# Patient Record
Sex: Female | Born: 1985 | Race: White | Hispanic: No | Marital: Married | Smoking: Never smoker
Health system: Southern US, Community
[De-identification: ages and names within clinical notes are randomized; demographics above are authoritative.]

## PROBLEM LIST (undated history)

## (undated) ENCOUNTER — Inpatient Hospital Stay (HOSPITAL_COMMUNITY): Payer: Self-pay

## (undated) DIAGNOSIS — S060X9A Concussion with loss of consciousness of unspecified duration, initial encounter: Secondary | ICD-10-CM

## (undated) DIAGNOSIS — S060XAA Concussion with loss of consciousness status unknown, initial encounter: Secondary | ICD-10-CM

## (undated) DIAGNOSIS — M2530 Other instability, unspecified joint: Secondary | ICD-10-CM

## (undated) DIAGNOSIS — B019 Varicella without complication: Secondary | ICD-10-CM

## (undated) DIAGNOSIS — K5909 Other constipation: Secondary | ICD-10-CM

## (undated) DIAGNOSIS — I7300 Raynaud's syndrome without gangrene: Secondary | ICD-10-CM

## (undated) HISTORY — DX: Raynaud's syndrome without gangrene: I73.00

## (undated) HISTORY — DX: Other instability, unspecified joint: M25.30

## (undated) HISTORY — DX: Varicella without complication: B01.9

## (undated) HISTORY — PX: WISDOM TOOTH EXTRACTION: SHX21

## (undated) HISTORY — DX: Other constipation: K59.09

## (undated) HISTORY — DX: Concussion with loss of consciousness of unspecified duration, initial encounter: S06.0X9A

## (undated) HISTORY — DX: Concussion with loss of consciousness status unknown, initial encounter: S06.0XAA

---
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
## 2004-12-19 ENCOUNTER — Other Ambulatory Visit: Admission: RE | Admit: 2004-12-19 | Discharge: 2004-12-19 | Payer: Self-pay | Admitting: Obstetrics and Gynecology

## 2007-08-18 ENCOUNTER — Ambulatory Visit: Payer: Self-pay | Admitting: Internal Medicine

## 2007-08-18 DIAGNOSIS — I7300 Raynaud's syndrome without gangrene: Secondary | ICD-10-CM | POA: Insufficient documentation

## 2007-08-18 DIAGNOSIS — Z8719 Personal history of other diseases of the digestive system: Secondary | ICD-10-CM | POA: Insufficient documentation

## 2007-08-18 DIAGNOSIS — M25569 Pain in unspecified knee: Secondary | ICD-10-CM | POA: Insufficient documentation

## 2007-08-22 ENCOUNTER — Encounter: Payer: Self-pay | Admitting: Internal Medicine

## 2007-12-02 ENCOUNTER — Telehealth: Payer: Self-pay | Admitting: Internal Medicine

## 2007-12-31 ENCOUNTER — Telehealth: Payer: Self-pay | Admitting: *Deleted

## 2008-03-12 ENCOUNTER — Ambulatory Visit: Payer: Self-pay | Admitting: Internal Medicine

## 2008-03-12 DIAGNOSIS — K5909 Other constipation: Secondary | ICD-10-CM | POA: Insufficient documentation

## 2008-06-17 ENCOUNTER — Ambulatory Visit: Payer: Self-pay | Admitting: Internal Medicine

## 2008-06-17 DIAGNOSIS — R112 Nausea with vomiting, unspecified: Secondary | ICD-10-CM | POA: Insufficient documentation

## 2008-06-18 ENCOUNTER — Telehealth: Payer: Self-pay | Admitting: Internal Medicine

## 2008-06-18 LAB — CONVERTED CEMR LAB: hCG, Beta Chain, Quant, S: 0.67 milliintl units/mL

## 2009-04-14 ENCOUNTER — Inpatient Hospital Stay (HOSPITAL_COMMUNITY): Admission: AD | Admit: 2009-04-14 | Discharge: 2009-04-15 | Payer: Self-pay | Admitting: Obstetrics and Gynecology

## 2009-09-05 ENCOUNTER — Ambulatory Visit: Payer: Self-pay | Admitting: Internal Medicine

## 2009-09-07 ENCOUNTER — Ambulatory Visit: Payer: Self-pay | Admitting: Internal Medicine

## 2010-03-14 NOTE — Assessment & Plan Note (Signed)
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  She enjoys excellent health, takes no controlled  medications, and has no activity restrictions.  She has had PPD skin test in the past, which have always been negative  Preventive Screening-Counseling & Management  Alcohol-Tobacco     Smoking Status: never  Allergies (verified): 1)  ! Beta Blockers  Past History:  Past Medical History: Reviewed history from 08/18/2007 and no changes required. mild Raynaud's phenomenon right knee instability  Past Surgical History: Reviewed history from 08/18/2007 and no changes required. 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    Physical Exam  General:  Well-developed,well-nourished,in no acute distress; alert,appropriate and cooperative throughout examination Head:  Normocephalic and atraumatic without obvious abnormalities. No apparent alopecia or balding. Eyes:  No corneal or conjunctival inflammation noted. EOMI. Perrla. Funduscopic exam benign, without hemorrhages, exudates or papilledema. Vision grossly normal. Ears:  External ear exam shows no significant lesions or deformities.  Otoscopic examination reveals clear canals, tympanic membranes are intact bilaterally without bulging, retraction, inflammation or discharge. Hearing is grossly normal bilaterally. Mouth:  Oral mucosa and oropharynx without lesions or exudates.  Teeth in good repair. Neck:  No deformities, masses, or tenderness noted. Chest Wall:  No deformities, masses, or tenderness noted. Lungs:  Normal respiratory effort, chest expands symmetrically. Lungs are clear to auscultation, no crackles or wheezes. Heart:  Normal rate and regular rhythm. S1 and S2 normal without gallop, murmur, click, rub or other extra sounds. Abdomen:  Bowel sounds positive,abdomen soft and non-tender without masses, organomegaly or hernias noted. Msk:  No deformity or scoliosis noted of thoracic or lumbar spine.   Pulses:  R and L carotid,radial,femoral,dorsalis pedis and posterior tibial pulses are full and equal bilaterally Extremities:  No clubbing, cyanosis, edema, or deformity noted with normal full range of motion of all joints.   Neurologic:  alert & oriented X3, cranial nerves II-XII intact, and gait normal.  alert & oriented X3, cranial nerves II-XII intact, and gait normal.   Skin:  Intact without suspicious lesions or rashes Cervical Nodes:  No lymphadenopathy noted Psych:  Cognition and judgment appear intact. Alert and cooperative with normal attention span and concentration. No apparent  delusions, illusions, hallucinations   Impression &  Recommendations:  Problem # 1:  PREVENTIVE HEALTH CARE (ICD-V70.0)  Complete Medication List: 1)  Ortho Tri-cyclen Lo 0.025 Mg Tabs (Norgestimate-ethinyl estradiol) .Marland Kitchen.. 1 once daily  Other Orders: TB Skin Test [phone]) Admin 1st Vaccine ([id number])  Patient Instructions: 1)  It is important that you exercise regularly at least 20 minutes 5 times a week. If you develop chest pain, have severe difficulty breathing, or feel very tired , stop exercising immediately and seek medical attention.   Immunizations Administered:  PPD Skin Test:    Vaccine Type: PPD    Site: right forearm    Mfr: Sanofi Pasteur    Dose: 0.1 ml    Route: ID    Given by: Duard Brady LPN    Exp. Date: 11/25/2010    Lot #: [id number]    Physician counseled: yes    Appended Document: health exam for teachers     Allergies: 1)  ! Beta Blockers   Complete Medication List: 1)  Ortho Tri-cyclen Lo 0.025 Mg Tabs (Norgestimate-ethinyl estradiol) .Marland Kitchen.. 1 once daily  Other Orders: Tdap => 41yrs IM ([id number]) Admin 1st Vaccine (56213)    Immunizations Administered:  Tetanus Vaccine:    Vaccine Type: Tdap    Site: left deltoid    Mfr: GlaxoSmithKline    Dose: 0.5 ml    Route: IM    Given by: Duard Brady LPN    Exp. Date: 03/03/2011    Lot #: [id number]    VIS given: 12/31/06 version given September 05, 2009.    Physician counseled: yes

## 2010-03-14 NOTE — Assessment & Plan Note (Signed)
Summary: tb reading  Nurse Visit   Vitals Entered By: Duard Brady LPN (September 07, 2009 11:36 AM)  Allergies: 1)  ! Beta Blockers  PPD Results    Date of reading: 09/07/2009    Results: < 5mm    Interpretation: negative

## 2010-05-08 LAB — CBC
HCT: 37 % (ref 36.0–46.0)
HCT: 43.6 % (ref 36.0–46.0)
Hemoglobin: 12.4 g/dL (ref 12.0–15.0)
Hemoglobin: 14.4 g/dL (ref 12.0–15.0)
MCHC: 33.5 g/dL (ref 30.0–36.0)
Platelets: 200 10*3/uL (ref 150–400)
RBC: 3.82 MIL/uL — ABNORMAL LOW (ref 3.87–5.11)
RBC: 4.46 MIL/uL (ref 3.87–5.11)
WBC: 12.1 10*3/uL — ABNORMAL HIGH (ref 4.0–10.5)

## 2010-10-13 ENCOUNTER — Ambulatory Visit (INDEPENDENT_AMBULATORY_CARE_PROVIDER_SITE_OTHER): Payer: BC Managed Care – PPO | Admitting: Internal Medicine

## 2010-10-13 ENCOUNTER — Encounter: Payer: Self-pay | Admitting: Internal Medicine

## 2010-10-13 VITALS — BP 100/68 | Temp 98.2°F | Wt 112.0 lb

## 2010-10-13 DIAGNOSIS — J069 Acute upper respiratory infection, unspecified: Secondary | ICD-10-CM

## 2010-10-13 NOTE — Progress Notes (Signed)
  Subjective:    Patient ID: Sarah Mullins, female    DOB: Mar 05, 1985, 25 y.o.   MRN: [id number]  HPI 25 year old patient who presents with a five-day history of head and chest congestion. She has significant sinus congestion her that has improved somewhat she has had productive cough and also has improved she has had no fever but still feels a bit weak and washed out. 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 Denies any fever or chills no chest pain shortness of breath or wheezing.   Review of Systems  Constitutional: Positive for fatigue. Negative for fever and diaphoresis.  HENT: Positive for congestion. Negative for hearing loss, sore throat, rhinorrhea, dental problem, sinus pressure and tinnitus.   Eyes: Negative for pain, discharge and visual disturbance.  Respiratory: Positive for cough. Negative for shortness of breath.   Cardiovascular: Negative for chest pain, palpitations and leg swelling.  Gastrointestinal: Negative for nausea, vomiting, abdominal pain, diarrhea, constipation, blood in stool and abdominal distention.  Genitourinary: Negative for dysuria, urgency, frequency, hematuria, flank pain, vaginal bleeding, vaginal discharge, difficulty urinating, vaginal pain and pelvic pain.  Musculoskeletal: Negative for joint swelling, arthralgias and gait problem.  Skin: Negative for rash.  Neurological: Negative for dizziness, syncope, speech difficulty, weakness, numbness and headaches.  Hematological: Negative for adenopathy.  Psychiatric/Behavioral: Negative for behavioral problems, dysphoric mood and agitation. The patient is not nervous/anxious.        Objective:   Physical Exam  Constitutional: She is oriented to person, place, and time. She appears well-developed and well-nourished. No distress.  HENT:  Head: Normocephalic.  Right Ear: External ear normal.  Left Ear: External ear normal.  Mouth/Throat: Oropharynx is clear and moist.  Eyes:  Conjunctivae and EOM are normal. Pupils are equal, round, and reactive to light.  Neck: Normal range of motion. Neck supple. No thyromegaly present.  Cardiovascular: Normal rate, regular rhythm, normal heart sounds and intact distal pulses.   Pulmonary/Chest: Effort normal and breath sounds normal. No respiratory distress. She has no wheezes. She has no rales. She exhibits no tenderness.  Abdominal: Soft. Bowel sounds are normal. She exhibits no mass. There is no tenderness.  Musculoskeletal: Normal range of motion.  Lymphadenopathy:    She has no cervical adenopathy.  Neurological: She is alert and oriented to person, place, and time.  Skin: Skin is warm and dry. No rash noted.  Psychiatric: She has a normal mood and affect. Her behavior is normal.          Assessment & Plan:    Viral URI-  will treat symptomatically

## 2010-10-13 NOTE — Patient Instructions (Signed)
Get plenty of rest, Drink lots of  clear liquids, and use Tylenol or ibuprofen for fever and discomfort.    Rezira 1/2-1  tsp every 6 hours for cough  Acute bronchitis symptoms for less than 10 days are generally not helped by antibiotics.  Take over-the-counter expectorants and cough medications such as  Mucinex DM.  Call if there is no improvement in 5 to 7 days or if he developed worsening cough, fever, or new symptoms, such as shortness of breath or chest pain.

## 2011-02-13 NOTE — L&D Delivery Note (Signed)
Delivery Note At 10:27 PM a viable and healthy female was delivered via Vaginal, Spontaneous Delivery (Presentation: Left Occiput; Anterior  ).  APGAR: 9 ,9 ; weight Pending.   Placenta status: Intact, Spontaneous.  Cord: 3V   Anesthesia: Local  Episiotomy: None Lacerations: Left Periurethral Suture Repair: 4-0 vicryl Est. Blood Loss (mL): 350 cc  Mom to postpartum.  Baby to nursery-stable.  Naji Mehringer H. 01/03/2012, 10:55 PM

## 2011-04-24 ENCOUNTER — Ambulatory Visit (INDEPENDENT_AMBULATORY_CARE_PROVIDER_SITE_OTHER): Payer: BC Managed Care – PPO | Admitting: Internal Medicine

## 2011-04-24 ENCOUNTER — Encounter: Payer: Self-pay | Admitting: Internal Medicine

## 2011-04-24 VITALS — BP 88/60 | Temp 98.2°F | Wt 120.0 lb

## 2011-04-24 DIAGNOSIS — J069 Acute upper respiratory infection, unspecified: Secondary | ICD-10-CM

## 2011-04-24 MED ORDER — HYDROCODONE-HOMATROPINE 5-1.5 MG/5ML PO SYRP: 5.0000 mL | ORAL_SOLUTION | Freq: Four times a day (QID) | ORAL | Status: AC | PRN

## 2011-04-24 NOTE — Patient Instructions (Signed)
Get plenty of rest, Drink lots of  clear liquids, and use Tylenol or ibuprofen for fever and discomfort.    Use saline irrigation, warm  moist compresses and over-the-counter decongestants only as directed.  Call if there is no improvement in 5 to 7 days, or sooner if you develop increasing pain, fever, or any new symptoms. 

## 2011-04-24 NOTE — Progress Notes (Signed)
  Subjective:    Patient ID: Sarah Mullins, female    DOB: [birth year], 26 y.o.   MRN: [id number]  HPI  26 year old patient who presents with a six-day history of head and chest congestion. She has had a mild productive cough. No documented fever shortness of breath wheezing or chills. She has been using a number of OTC medications with little benefit. She is off birth control pills but her periods have been regular. Over-the-counter medications have included Robitussin Alka-Seltzer and DayQuil    Review of Systems  Constitutional: Negative.   HENT: Positive for congestion, rhinorrhea and postnasal drip. Negative for hearing loss, sore throat, dental problem, sinus pressure and tinnitus.   Eyes: Negative for pain, discharge and visual disturbance.  Respiratory: Positive for cough. Negative for shortness of breath.   Cardiovascular: Negative for chest pain, palpitations and leg swelling.  Gastrointestinal: Negative for nausea, vomiting, abdominal pain, diarrhea, constipation, blood in stool and abdominal distention.  Genitourinary: Negative for dysuria, urgency, frequency, hematuria, flank pain, vaginal bleeding, vaginal discharge, difficulty urinating, vaginal pain and pelvic pain.  Musculoskeletal: Negative for joint swelling, arthralgias and gait problem.  Skin: Negative for rash.  Neurological: Negative for dizziness, syncope, speech difficulty, weakness, numbness and headaches.  Hematological: Negative for adenopathy.  Psychiatric/Behavioral: Negative for behavioral problems, dysphoric mood and agitation. The patient is not nervous/anxious.        Objective:   Physical Exam  Constitutional: She is oriented to person, place, and time. She appears well-developed and well-nourished.  HENT:  Head: Normocephalic.  Right Ear: External ear normal.  Left Ear: External ear normal.  Mouth/Throat: Oropharynx is clear and moist.       Considerable nasal congestion  Eyes: Conjunctivae and  EOM are normal. Pupils are equal, round, and reactive to light.  Neck: Normal range of motion. Neck supple. No thyromegaly present.  Cardiovascular: Normal rate, regular rhythm, normal heart sounds and intact distal pulses.   Pulmonary/Chest: Effort normal and breath sounds normal.  Abdominal: Soft. Bowel sounds are normal. She exhibits no mass. There is no tenderness.  Musculoskeletal: Normal range of motion.  Lymphadenopathy:    She has no cervical adenopathy.  Neurological: She is alert and oriented to person, place, and time.  Skin: Skin is warm and dry. No rash noted.  Psychiatric: She has a normal mood and affect. Her behavior is normal.          Assessment & Plan:   Viral URI. Will treat symptomatically

## 2011-05-03 ENCOUNTER — Telehealth: Payer: Self-pay | Admitting: *Deleted

## 2011-05-03 MED ORDER — AMOXICILLIN-POT CLAVULANATE 875-125 MG PO TABS: 1.0000 | ORAL_TABLET | Freq: Two times a day (BID) | ORAL | Status: AC

## 2011-05-03 NOTE — Telephone Encounter (Signed)
augmentim [phone]  #20 one BID

## 2011-05-03 NOTE — Telephone Encounter (Signed)
Notified pt. 

## 2011-05-03 NOTE — Telephone Encounter (Signed)
Pt is having sinus infection symptoms, headache, facial pain, and ear pain.  Is asking for an antibiotics, and is pregnant.  Would prefer not to come in.

## 2011-05-03 NOTE — Telephone Encounter (Signed)
Last seen 04/24/11 - head and chest congestion Please advise

## 2011-06-12 LAB — OB RESULTS CONSOLE ABO/RH

## 2011-06-12 LAB — OB RESULTS CONSOLE HEPATITIS B SURFACE ANTIGEN: Hepatitis B Surface Ag: NEGATIVE

## 2011-06-12 LAB — OB RESULTS CONSOLE GC/CHLAMYDIA
Chlamydia: NEGATIVE
Gonorrhea: NEGATIVE

## 2011-06-12 LAB — OB RESULTS CONSOLE ANTIBODY SCREEN: Antibody Screen: NEGATIVE

## 2011-10-30 ENCOUNTER — Encounter (HOSPITAL_COMMUNITY): Payer: Self-pay

## 2011-10-30 ENCOUNTER — Inpatient Hospital Stay (HOSPITAL_COMMUNITY)
Admission: AD | Admit: 2011-10-30 | Discharge: 2011-10-30 | Disposition: A | Discharge status: Home / Self Care | Payer: BC Managed Care – PPO | Source: Ambulatory Visit | Attending: Obstetrics and Gynecology | Admitting: Obstetrics and Gynecology

## 2011-10-30 DIAGNOSIS — O479 False labor, unspecified: Secondary | ICD-10-CM

## 2011-10-30 DIAGNOSIS — O4700 False labor before 37 completed weeks of gestation, unspecified trimester: Secondary | ICD-10-CM | POA: Insufficient documentation

## 2011-10-30 LAB — URINE MICROSCOPIC-ADD ON

## 2011-10-30 LAB — URINALYSIS, ROUTINE W REFLEX MICROSCOPIC
Bilirubin Urine: NEGATIVE
Urobilinogen, UA: 0.2 mg/dL (ref 0.0–1.0)
pH: 6 (ref 5.0–8.0)

## 2011-10-30 NOTE — MAU Note (Signed)
Patient states she has been having irregular "Braxton Hicks" contractions for a few days, more uncomfortable today. Denies any bleeding or leaking. Reports good fetal movement.

## 2011-10-30 NOTE — MAU Provider Note (Signed)
Chief Complaint:  Labor Eval   First Provider Initiated Contact with Patient 10/30/11 1652      HPI: Sarah Mullins is a 26 y.o. G2P1001 at 42w6dwho presents to maternity admissions reporting irregular cramping over the weekend with worsening discomfort with cramping today while she was sitting at work.  She reports contractions are irregular, no more than 4/hour.  She reports good fetal movement, denies LOF, vaginal bleeding, vaginal itching/burning, urinary symptoms, h/a, dizziness, n/v, or fever/chills.      Past Medical History: Past Medical History  Diagnosis Date  . Raynaud's phenomenon     mild  . Instability of joint     left knee     Past Surgical History: Past Surgical History  Procedure Date  . Appendectomy     Family History: Family History  Problem Relation Age of Onset  . Cancer Mother     cervical cancer  . Cancer Maternal Grandmother     ovarian cancer  . Heart disease Maternal Grandfather   . Dementia Paternal Grandfather     senile dementia or possible stroke    Social History: History  Substance Use Topics  . Smoking status: Never Smoker   . Smokeless tobacco: Never Used  . Alcohol Use: No    Allergies:  Allergies  Allergen Reactions  . Beta Adrenergic Blockers     REACTION: circulation problems    Meds:  Prescriptions prior to admission  Medication Sig Dispense Refill  . acetaminophen (TYLENOL) 325 MG tablet Take 650 mg by mouth every 6 (six) hours as needed. headache      . pseudoephedrine (SUDAFED) 30 MG tablet Take 30 mg by mouth every 4 (four) hours as needed. congestion        ROS: Pertinent findings in history of present illness.  Physical Exam  Blood pressure 113/60, pulse 87, temperature 98.4 F (36.9 C), temperature source Oral, resp. rate 16, height 5\' 2"  (1.575 m), weight 61.508 kg (135 lb 9.6 oz), SpO2 100.00%. GENERAL: Well-developed, well-nourished female in no acute distress.  HEENT: normocephalic HEART: normal  rate RESP: normal effort ABDOMEN: Soft, non-tender, gravid appropriate for gestational age EXTREMITIES: Nontender, no edema NEURO: alert and oriented SPECULUM EXAM: NEFG, physiologic discharge, no blood, cervix clean Cervix 0/long/high, posterior, firm.  External os 1cm.    FHT:  Baseline 135 , moderate variability, accelerations present, no decelerations Contractions: occasional lasting 50 sec   Results for orders placed during the hospital encounter of 10/30/11 (from the past 48 hour(s))  URINALYSIS, ROUTINE W REFLEX MICROSCOPIC     Status: Abnormal   Collection Time   10/30/11  3:35 PM      Component Value Range Comment   Color, Urine YELLOW  YELLOW    APPearance CLEAR  CLEAR    Specific Gravity, Urine 1.010  1.005 - 1.030    pH 6.0  5.0 - 8.0    Glucose, UA NEGATIVE  NEGATIVE mg/dL    Hgb urine dipstick NEGATIVE  NEGATIVE    Bilirubin Urine NEGATIVE  NEGATIVE    Ketones, ur 15 (*) NEGATIVE mg/dL    Protein, ur NEGATIVE  NEGATIVE mg/dL    Urobilinogen, UA 0.2  0.0 - 1.0 mg/dL    Nitrite NEGATIVE  NEGATIVE    Leukocytes, UA TRACE (*) NEGATIVE   URINE MICROSCOPIC-ADD ON     Status: Abnormal   Collection Time   10/30/11  3:35 PM      Component Value Range Comment   Squamous Epithelial / LPF  FEW (*) RARE    WBC, UA 0-2  <3 WBC/hpf     Assessment: 1. Braxton Hicks contractions     Plan: Discharge home PTL precautions and fetal kick counts Increase PO fluids F/U with Dr Claiborne Billings Return to MAU as needed    Medication List     As of 10/30/2011  5:10 PM    ASK your doctor about these medications         acetaminophen 325 MG tablet   Commonly known as: TYLENOL   Take 650 mg by mouth every 6 (six) hours as needed. headache      pseudoephedrine 30 MG tablet   Commonly known as: SUDAFED   Take 30 mg by mouth every 4 (four) hours as needed. congestion        Sharen Counter Certified Nurse-Midwife 10/30/2011 5:10 PM

## 2012-01-01 ENCOUNTER — Telehealth (HOSPITAL_COMMUNITY): Payer: Self-pay | Admitting: *Deleted

## 2012-01-01 ENCOUNTER — Encounter (HOSPITAL_COMMUNITY): Payer: Self-pay | Admitting: *Deleted

## 2012-01-01 NOTE — Telephone Encounter (Signed)
Preadmission screen  

## 2012-01-02 ENCOUNTER — Telehealth (HOSPITAL_COMMUNITY): Payer: Self-pay | Admitting: *Deleted

## 2012-01-02 NOTE — Telephone Encounter (Signed)
Preadmission screen  

## 2012-01-03 ENCOUNTER — Encounter (HOSPITAL_COMMUNITY): Payer: Self-pay | Admitting: Family Medicine

## 2012-01-03 ENCOUNTER — Inpatient Hospital Stay (HOSPITAL_COMMUNITY)
Admission: AD | Admit: 2012-01-03 | Discharge: 2012-01-05 | DRG: 373 | Disposition: A | Discharge status: Home / Self Care | Payer: BC Managed Care – PPO | Source: Ambulatory Visit | Attending: Obstetrics and Gynecology | Admitting: Obstetrics and Gynecology

## 2012-01-03 LAB — CBC
HCT: 41.8 % (ref 36.0–46.0)
Hemoglobin: 14.3 g/dL (ref 12.0–15.0)
MCV: 93.9 fL (ref 78.0–100.0)
Platelets: 227 10*3/uL (ref 150–400)
RBC: 4.45 MIL/uL (ref 3.87–5.11)
WBC: 15.4 10*3/uL — ABNORMAL HIGH (ref 4.0–10.5)

## 2012-01-03 MED ORDER — IBUPROFEN 600 MG PO TABS
600.0000 mg | ORAL_TABLET | Freq: Four times a day (QID) | ORAL | Status: DC | PRN
  Administered 2012-01-03: 600 mg via ORAL
  Filled 2012-01-03: qty 1

## 2012-01-03 MED ORDER — OXYCODONE-ACETAMINOPHEN 5-325 MG PO TABS
1.0000 | ORAL_TABLET | ORAL | Status: DC | PRN
Start: 2012-01-03 — End: 2012-01-04

## 2012-01-03 MED ORDER — OXYTOCIN 40 UNITS IN LACTATED RINGERS INFUSION - SIMPLE MED
62.5000 mL/h | INTRAVENOUS | Status: DC
  Filled 2012-01-03: qty 1000

## 2012-01-03 MED ORDER — LACTATED RINGERS IV SOLN: 500.0000 mL | INTRAVENOUS | Status: DC | PRN

## 2012-01-03 MED ORDER — CITRIC ACID-SODIUM CITRATE 334-500 MG/5ML PO SOLN: 30.0000 mL | ORAL | Status: DC | PRN

## 2012-01-03 MED ORDER — OXYTOCIN BOLUS FROM INFUSION
500.0000 mL | INTRAVENOUS | Status: DC
  Administered 2012-01-03: 500 mL via INTRAVENOUS

## 2012-01-03 MED ORDER — LACTATED RINGERS IV SOLN: INTRAVENOUS | Status: DC

## 2012-01-03 MED ORDER — LIDOCAINE HCL (PF) 1 % IJ SOLN
30.0000 mL | INTRAMUSCULAR | Status: DC | PRN
  Filled 2012-01-03: qty 30

## 2012-01-03 MED ORDER — ACETAMINOPHEN 325 MG PO TABS: 650.0000 mg | ORAL_TABLET | ORAL | Status: DC | PRN

## 2012-01-03 MED ORDER — FLEET ENEMA 7-19 GM/118ML RE ENEM: 1.0000 | ENEMA | Freq: Every day | RECTAL | Status: DC | PRN

## 2012-01-03 MED ORDER — ONDANSETRON HCL 4 MG/2ML IJ SOLN: 4.0000 mg | Freq: Four times a day (QID) | INTRAMUSCULAR | Status: DC | PRN

## 2012-01-03 NOTE — H&P (Signed)
Sarah Mullins is a 26 y.o. female presenting for contractions Pt was admitted from MAU c/o painful contractions that had started 1 hr before admission.  On admission she was 5-6 cm dilated.  Her pregnancy has been uncomplicated to this point. History OB History    Grav Para Term Preterm Abortions TAB SAB Ect Mult Living   2 1 1       1      Past Medical History  Diagnosis Date  . Raynaud's phenomenon     mild  . Instability of joint     left knee  . H/O varicella    Past Surgical History  Procedure Date  . Appendectomy   . Wisdom    Family History: family history includes Cancer in her maternal grandmother and mother; Dementia in her paternal grandfather; and Heart disease in her maternal grandfather. Social History:  reports that she has never smoked. She has never used smokeless tobacco. She reports that she does not drink alcohol or use illicit drugs.   Prenatal Transfer Tool  Maternal Diabetes: No Genetic Screening: Declined Maternal Ultrasounds/Referrals: Normal Fetal Ultrasounds or other Referrals:  None Maternal Substance Abuse:  No Significant Maternal Medications:  None Significant Maternal Lab Results:  None Other Comments:  None  ROS: as above  Dilation: 9 Effacement (%): 100 Station: +1 Exam by:: T. Sprague RN Blood pressure 96/48, pulse 187, temperature 97.5 F (36.4 C), temperature source Oral, resp. rate 22. Exam Physical Exam  Prenatal labs: ABO, Rh: O/Positive/-- (04/30 0000) Antibody: Negative (04/30 0000) Rubella: Immune (04/30 0000) RPR: Nonreactive (04/30 0000)  HBsAg: Negative (04/30 0000)  HIV: Non-reactive (04/30 0000)  GBS: Negative (10/22 0000)   Assessment/Plan: 1) Admit 2) Epidural on request    Tyrez Berrios H. 01/03/2012, 9:48 PM

## 2012-01-03 NOTE — Treatment Plan (Signed)
Report called to Tomie China, RN [employer], patient may go to [phone]

## 2012-01-03 NOTE — Treatment Plan (Signed)
Telephone call to Dr Tenny Craw. Explained chief complaint, cervical exam, fht tracing, GBS status and gestational age. Pt may be admitted to [facility] suites

## 2012-01-04 LAB — CBC
HCT: 35.9 % — ABNORMAL LOW (ref 36.0–46.0)
Hemoglobin: 12.1 g/dL (ref 12.0–15.0)
RBC: 3.85 MIL/uL — ABNORMAL LOW (ref 3.87–5.11)
RDW: 13 % (ref 11.5–15.5)
WBC: 19 10*3/uL — ABNORMAL HIGH (ref 4.0–10.5)

## 2012-01-04 MED ORDER — ZOLPIDEM TARTRATE 5 MG PO TABS: 5.0000 mg | ORAL_TABLET | Freq: Every evening | ORAL | Status: DC | PRN

## 2012-01-04 MED ORDER — WITCH HAZEL-GLYCERIN EX PADS: 1.0000 "application " | MEDICATED_PAD | CUTANEOUS | Status: DC | PRN

## 2012-01-04 MED ORDER — ONDANSETRON HCL 4 MG/2ML IJ SOLN: 4.0000 mg | INTRAMUSCULAR | Status: DC | PRN

## 2012-01-04 MED ORDER — IBUPROFEN 600 MG PO TABS
600.0000 mg | ORAL_TABLET | Freq: Four times a day (QID) | ORAL | Status: DC
  Administered 2012-01-04 – 2012-01-05 (×5): 600 mg via ORAL
  Filled 2012-01-04 (×5): qty 1

## 2012-01-04 MED ORDER — LANOLIN HYDROUS EX OINT: TOPICAL_OINTMENT | CUTANEOUS | Status: DC | PRN

## 2012-01-04 MED ORDER — PRENATAL MULTIVITAMIN CH
1.0000 | ORAL_TABLET | Freq: Every day | ORAL | Status: DC
  Administered 2012-01-04 – 2012-01-05 (×2): 1 via ORAL
  Filled 2012-01-04 (×2): qty 1

## 2012-01-04 MED ORDER — OXYCODONE-ACETAMINOPHEN 5-325 MG PO TABS: 1.0000 | ORAL_TABLET | ORAL | Status: DC | PRN

## 2012-01-04 MED ORDER — SENNOSIDES-DOCUSATE SODIUM 8.6-50 MG PO TABS
2.0000 | ORAL_TABLET | Freq: Every day | ORAL | Status: DC
  Administered 2012-01-04 (×2): 2 via ORAL

## 2012-01-04 MED ORDER — DIBUCAINE 1 % RE OINT: 1.0000 "application " | TOPICAL_OINTMENT | RECTAL | Status: DC | PRN

## 2012-01-04 MED ORDER — SIMETHICONE 80 MG PO CHEW: 80.0000 mg | CHEWABLE_TABLET | ORAL | Status: DC | PRN

## 2012-01-04 MED ORDER — ONDANSETRON HCL 4 MG PO TABS: 4.0000 mg | ORAL_TABLET | ORAL | Status: DC | PRN

## 2012-01-04 MED ORDER — BENZOCAINE-MENTHOL 20-0.5 % EX AERO
1.0000 "application " | INHALATION_SPRAY | CUTANEOUS | Status: DC | PRN
  Administered 2012-01-04: 1 via TOPICAL
  Filled 2012-01-04: qty 56

## 2012-01-04 MED ORDER — TETANUS-DIPHTH-ACELL PERTUSSIS 5-2.5-18.5 LF-MCG/0.5 IM SUSP
0.5000 mL | Freq: Once | INTRAMUSCULAR | Status: AC
  Administered 2012-01-04: 0.5 mL via INTRAMUSCULAR
  Filled 2012-01-04: qty 0.5

## 2012-01-04 MED ORDER — DIPHENHYDRAMINE HCL 25 MG PO CAPS: 25.0000 mg | ORAL_CAPSULE | Freq: Four times a day (QID) | ORAL | Status: DC | PRN

## 2012-01-04 NOTE — Discharge Summary (Signed)
Obstetric Discharge Summary Reason for Admission: onset of labor Prenatal Procedures: none Intrapartum Procedures: spontaneous vaginal delivery Postpartum Procedures: none Complications-Operative and Postpartum: 1st degree periurethral laceration Hemoglobin  Date Value Range Status  01/04/2012 12.1  12.0 - 15.0 g/dL Final     HCT  Date Value Range Status  01/04/2012 35.9* 36.0 - 46.0 % Final     Discharge Diagnoses: Term Pregnancy-delivered  Discharge Information: Date: 01/04/2012 Activity: pelvic rest Diet: routine Medications: None Condition: stable Instructions: refer to practice specific booklet Discharge to: home Follow-up Information    Follow up with Almon Hercules., MD. In 4 weeks.   Contact information:   [address] [place] [address] [phone]          Newborn Data: Live born female  Birth Weight: 6 lb 11.4 oz (3045 g) APGAR: 9, 9  Home with mother.  Adrian Specht A 01/04/2012, 7:52 AM

## 2012-01-04 NOTE — Progress Notes (Signed)
Patient is eating, ambulating, voiding.  Pain control is good.  Filed Vitals:   01/04/12 0001 01/04/12 0030 01/04/12 0145 01/04/12 0550  BP: 119/68 114/66 104/66 101/67  Pulse: 80 73 78 85  Temp: 97.6 F (36.4 C) 98.6 F (37 C) 99.1 F (37.3 C) 98.4 F (36.9 C)  TempSrc: Oral Oral Oral Oral  Resp: 16 18 18 16   SpO2:        Fundus firm Perineum without swelling.  Lab Results  Component Value Date   WBC 19.0* 01/04/2012   HGB 12.1 01/04/2012   HCT 35.9* 01/04/2012   MCV 93.2 01/04/2012   PLT 215 01/04/2012    --/--/O POS (11/21 2120)/RI  A/P Post partum day 1.  Routine care.  Expect d/c tomorrow.    Marly Schuld A

## 2012-01-05 NOTE — Progress Notes (Signed)
Patient is eating, ambulating, voiding.  Pain control is good.  Filed Vitals:   01/04/12 0550 01/04/12 1409 01/04/12 2203 01/05/12 0530  BP: 101/67 98/65 99/59  102/63  Pulse: 85 91 71 73  Temp: 98.4 F (36.9 C) 98 F (36.7 C) 97.7 F (36.5 C) 97.4 F (36.3 C)  TempSrc: Oral Oral Oral Oral  Resp: 16 18 18 18   SpO2:        Fundus firm Perineum without swelling.  Lab Results  Component Value Date   WBC 19.0* 01/04/2012   HGB 12.1 01/04/2012   HCT 35.9* 01/04/2012   MCV 93.2 01/04/2012   PLT 215 01/04/2012    --/--/O POS (11/21 2120)/RI  A/P Post partum day 2.  Routine care.  Expect d/c today.    Meagan Ancona A

## 2012-01-09 ENCOUNTER — Inpatient Hospital Stay (HOSPITAL_COMMUNITY): Admission: RE | Admit: 2012-01-09 | Payer: BC Managed Care – PPO | Source: Ambulatory Visit

## 2013-12-14 ENCOUNTER — Encounter (HOSPITAL_COMMUNITY): Payer: Self-pay | Admitting: Family Medicine

## 2014-01-29 ENCOUNTER — Inpatient Hospital Stay (HOSPITAL_COMMUNITY)
Admission: AD | Admit: 2014-01-29 | Discharge: 2014-01-29 | Disposition: A | Discharge status: Home / Self Care | Payer: BC Managed Care – PPO | Source: Ambulatory Visit | Attending: Obstetrics & Gynecology | Admitting: Obstetrics & Gynecology

## 2014-01-29 ENCOUNTER — Encounter (HOSPITAL_COMMUNITY): Payer: Self-pay | Admitting: *Deleted

## 2014-01-29 ENCOUNTER — Inpatient Hospital Stay (HOSPITAL_COMMUNITY): Payer: BC Managed Care – PPO

## 2014-01-29 DIAGNOSIS — O4691 Antepartum hemorrhage, unspecified, first trimester: Secondary | ICD-10-CM

## 2014-01-29 DIAGNOSIS — O200 Threatened abortion: Secondary | ICD-10-CM | POA: Insufficient documentation

## 2014-01-29 DIAGNOSIS — Z3A01 Less than 8 weeks gestation of pregnancy: Secondary | ICD-10-CM | POA: Insufficient documentation

## 2014-01-29 DIAGNOSIS — O209 Hemorrhage in early pregnancy, unspecified: Secondary | ICD-10-CM

## 2014-01-29 LAB — POCT PREGNANCY, URINE: PREG TEST UR: POSITIVE — AB

## 2014-01-29 NOTE — MAU Note (Addendum)
Spotting started @ 1058. Was on the tissue when wiping, was red. Has had viable IUP confirmed by U/S in office.

## 2014-01-29 NOTE — MAU Provider Note (Signed)
History     CSN: [id number]  Arrival date and time: 01/29/14 1247   First Provider Initiated Contact with Patient 01/29/14 1421      Chief Complaint  Patient presents with  . Vaginal Bleeding   HPI Sarah Mullins 28 y.o. [id number] @[redacted]w[redacted]d  presents to MAU complaining of spotting that is noticed on wiping but not on pad.  No other symptoms: denies pain/ cramping/ nausea/ vomiting/ fever/ weakness.  No prior problems with this or other pregnancies.  Pt has been seen for NOB with Dr. Henderson CloudHorvath and U/S confirmed dating per pt.  OB History    Gravida Para Term Preterm AB TAB SAB Ectopic Multiple Living   3 2 2       2       Past Medical History  Diagnosis Date  . Raynaud's phenomenon     mild  . Instability of joint     left knee  . H/O varicella     Past Surgical History  Procedure Laterality Date  . Appendectomy    . Wisdom      Family History  Problem Relation Age of Onset  . Cancer Mother     cervical cancer  . Cancer Maternal Grandmother     uterine cancer  . Heart disease Maternal Grandfather   . Dementia Paternal Grandfather     senile dementia or possible stroke    History  Substance Use Topics  . Smoking status: Never Smoker   . Smokeless tobacco: Never Used  . Alcohol Use: No    Allergies:  Allergies  Allergen Reactions  . Beta Adrenergic Blockers     REACTION: circulation problems    Prescriptions prior to admission  Medication Sig Dispense Refill Last Dose  . acetaminophen (TYLENOL) 325 MG tablet Take 650 mg by mouth every 6 (six) hours as needed for headache.   Past Week at Unknown time  . Docusate Calcium (STOOL SOFTENER PO) Take 1 tablet by mouth daily.   01/28/2014 at Unknown time  . flintstones complete (FLINTSTONES) 60 MG chewable tablet Chew 1 tablet by mouth daily.   01/28/2014 at Unknown time  . FOLIC ACID PO Take 1 tablet by mouth daily.   01/28/2014 at Unknown time    ROS Pertinent ROS in HPI Physical Exam   Blood pressure  117/65, pulse 87, temperature 97.6 F (36.4 C), temperature source Oral, resp. rate 18, height 5\' 2"  (1.575 m), weight 122 lb (55.339 kg), unknown if currently breastfeeding.  Physical Exam  Constitutional: She is oriented to person, place, and time. She appears well-developed and well-nourished.  HENT:  Head: Normocephalic and atraumatic.  Eyes: EOM are normal.  Neck: Normal range of motion.  Cardiovascular: Normal rate and regular rhythm.   Respiratory: Effort normal and breath sounds normal. No respiratory distress.  GI: Soft. Bowel sounds are normal. She exhibits no distension. There is no tenderness.  Genitourinary:  Pooling of dark red blood noted in vagina.   Cervix is closed.  No tenderness noted.  Musculoskeletal: Normal range of motion.  Neurological: She is alert and oriented to person, place, and time.  Skin: Skin is warm and dry.  Psychiatric: She has a normal mood and affect.   [place] Ob Comp Less 14 Wks  01/29/2014   CLINICAL DATA:  Positive pregnancy test, vaginal bleeding  EXAM: OBSTETRIC <14 WK US AND TRANSVAGINAL OB US  TECHNIQUE: Both transabdominal and transvaginal ultrasound examinations were performed for complete evaluation of the gestation as well as the maternal  uterus, adnexal regions, and pelvic cul-de-sac. Transvaginal technique was performed to assess early pregnancy.  COMPARISON:  None available. The patient reports an outside in office ultrasound 2 weeks ago but neither the report nor images are available for comparison.  FINDINGS: Intrauterine gestational sac: Visualized/normal in shape.  Yolk sac:  Visualized  Embryo:  Visualized  Cardiac Activity: Not visualized  CRL:   4  mm   6 w 1d                  US EDC: 09/23/14  Maternal uterus/adnexae: The ovaries are normal. Left ovarian corpus luteum noted. No free fluid.  IMPRESSION: Intrauterine gestational sac, yolk sac, and fetal pole identified but no cardiac activity is yet visualized. Followup ultrasound is  recommended in 7 is 10 days to document appropriate pregnancy progression and for definitive dating.   Electronically Signed   By: Christiana PellantGretchen  Green M.D.   On: 01/29/2014 15:29   [place] Ob Transvaginal  01/29/2014   CLINICAL DATA:  Positive pregnancy test, vaginal bleeding  EXAM: OBSTETRIC <14 WK US AND TRANSVAGINAL OB US  TECHNIQUE: Both transabdominal and transvaginal ultrasound examinations were performed for complete evaluation of the gestation as well as the maternal uterus, adnexal regions, and pelvic cul-de-sac. Transvaginal technique was performed to assess early pregnancy.  COMPARISON:  None available. The patient reports an outside in office ultrasound 2 weeks ago but neither the report nor images are available for comparison.  FINDINGS: Intrauterine gestational sac: Visualized/normal in shape.  Yolk sac:  Visualized  Embryo:  Visualized  Cardiac Activity: Not visualized  CRL:   4  mm   6 w 1d                  US EDC: 09/23/14  Maternal uterus/adnexae: The ovaries are normal. Left ovarian corpus luteum noted. No free fluid.  IMPRESSION: Intrauterine gestational sac, yolk sac, and fetal pole identified but no cardiac activity is yet visualized. Followup ultrasound is recommended in 7 is 10 days to document appropriate pregnancy progression and for definitive dating.   Electronically Signed   By: Christiana PellantGretchen  Green M.D.   On: 01/29/2014 15:29       MAU Course  Procedures  MDM Discussed U/S report with Dr. Mora ApplPinn.  She advises for pt to leave MAU and report directly to the office for counseling and plan.  Assessment and Plan  A: Threatened AB/vaginal bleeding in pregnancy  P: Discharge to home - pt to drive herself directly to clinic to see Dr. Mora ApplPinn Patient may return to MAU as needed or if her condition were to change or worsen   Bertram Denvereague Clark, Karen E 01/29/2014, 2:22 PM

## 2014-01-29 NOTE — Discharge Instructions (Signed)
Pelvic Rest °Pelvic rest is sometimes recommended for women when:  °· The placenta is partially or completely covering the opening of the cervix (placenta previa). °· There is bleeding between the uterine wall and the amniotic sac in the first trimester (subchorionic hemorrhage). °· The cervix begins to open without labor starting (incompetent cervix, cervical insufficiency). °· The labor is too early (preterm labor). °HOME CARE INSTRUCTIONS °· Do not have sexual intercourse, stimulation, or an orgasm. °· Do not use tampons, douche, or put anything in the vagina. °· Do not lift anything over 10 pounds (4.5 kg). °· Avoid strenuous activity or straining your pelvic muscles. °SEEK MEDICAL CARE IF:  °· You have any vaginal bleeding during pregnancy. Treat this as a potential emergency. °· You have cramping pain felt low in the stomach (stronger than menstrual cramps). °· You notice vaginal discharge (watery, mucus, or bloody). °· You have a low, dull backache. °· There are regular contractions or uterine tightening. °SEEK IMMEDIATE MEDICAL CARE IF: °You have vaginal bleeding and have placenta previa.  °Document Released: 05/26/2010 Document Revised: 04/23/2011 Document Reviewed: 05/26/2010 °ExitCare® Patient Information ©2015 [employer]. This information is not intended to replace advice given to you by your health care provider. Make sure you discuss any questions you have with your health care provider. ° °

## 2014-02-12 NOTE — L&D Delivery Note (Signed)
Delivery Note At 3:01 PM a viable and healthy female was delivered via Vaginal, Spontaneous Delivery (Presentation: LOA ).  APGAR: 9, 9; weight  .   Placenta status: Intact, Spontaneous.  Cord:  with the following complications: .  Cord pH: nA  Anesthesia: Epidural  Episiotomy: None Lacerations: Periurethral Suture Repair: 3.0 vicryl rapide Est. Blood Loss (mL): 52  Mom to postpartum.  Baby to Couplet care / Skin to Skin.  Sarah Mullins J 02/01/2015, 3:46 PM

## 2014-06-16 LAB — OB RESULTS CONSOLE RPR: RPR: NONREACTIVE

## 2014-06-16 LAB — OB RESULTS CONSOLE HIV ANTIBODY (ROUTINE TESTING): HIV: NONREACTIVE

## 2014-12-04 ENCOUNTER — Encounter (HOSPITAL_COMMUNITY): Payer: Self-pay | Admitting: *Deleted

## 2014-12-28 LAB — OB RESULTS CONSOLE GBS: STREP GROUP B AG: NEGATIVE

## 2015-02-01 ENCOUNTER — Inpatient Hospital Stay (HOSPITAL_COMMUNITY): Payer: BC Managed Care – PPO | Admitting: Anesthesiology

## 2015-02-01 ENCOUNTER — Other Ambulatory Visit: Payer: Self-pay | Admitting: Obstetrics and Gynecology

## 2015-02-01 ENCOUNTER — Inpatient Hospital Stay (HOSPITAL_COMMUNITY)
Admission: AD | Admit: 2015-02-01 | Discharge: 2015-02-02 | DRG: 775 | Disposition: A | Discharge status: Home / Self Care | Payer: BC Managed Care – PPO | Source: Ambulatory Visit | Attending: Obstetrics and Gynecology | Admitting: Obstetrics and Gynecology

## 2015-02-01 ENCOUNTER — Encounter (HOSPITAL_COMMUNITY): Payer: Self-pay | Admitting: *Deleted

## 2015-02-01 DIAGNOSIS — Z8759 Personal history of other complications of pregnancy, childbirth and the puerperium: Secondary | ICD-10-CM

## 2015-02-01 DIAGNOSIS — O480 Post-term pregnancy: Principal | ICD-10-CM | POA: Diagnosis present

## 2015-02-01 DIAGNOSIS — Z3A41 41 weeks gestation of pregnancy: Secondary | ICD-10-CM | POA: Diagnosis not present

## 2015-02-01 LAB — CBC
HCT: 41.1 % (ref 36.0–46.0)
Hemoglobin: 14 g/dL (ref 12.0–15.0)
MCH: 32.6 pg (ref 26.0–34.0)
MCHC: 34.1 g/dL (ref 30.0–36.0)
MCV: 95.6 fL (ref 78.0–100.0)
PLATELETS: 210 10*3/uL (ref 150–400)
RBC: 4.3 MIL/uL (ref 3.87–5.11)
RDW: 13.3 % (ref 11.5–15.5)
WBC: 11.9 10*3/uL — AB (ref 4.0–10.5)

## 2015-02-01 MED ORDER — OXYTOCIN BOLUS FROM INFUSION
500.0000 mL | INTRAVENOUS | Status: DC
Start: 2015-02-01 — End: 2015-02-01

## 2015-02-01 MED ORDER — TERBUTALINE SULFATE 1 MG/ML IJ SOLN
0.2500 mg | Freq: Once | INTRAMUSCULAR | Status: DC | PRN
  Filled 2015-02-01: qty 1

## 2015-02-01 MED ORDER — IBUPROFEN 600 MG PO TABS
600.0000 mg | ORAL_TABLET | Freq: Four times a day (QID) | ORAL | Status: DC
  Administered 2015-02-01 – 2015-02-02 (×4): 600 mg via ORAL
  Filled 2015-02-01 (×4): qty 1

## 2015-02-01 MED ORDER — DIBUCAINE 1 % RE OINT: 1.0000 "application " | TOPICAL_OINTMENT | RECTAL | Status: DC | PRN

## 2015-02-01 MED ORDER — PHENYLEPHRINE 40 MCG/ML (10ML) SYRINGE FOR IV PUSH (FOR BLOOD PRESSURE SUPPORT)
80.0000 ug | PREFILLED_SYRINGE | INTRAVENOUS | Status: DC | PRN
  Filled 2015-02-01: qty 20
  Filled 2015-02-01: qty 2

## 2015-02-01 MED ORDER — DIPHENHYDRAMINE HCL 50 MG/ML IJ SOLN: 12.5000 mg | INTRAMUSCULAR | Status: DC | PRN

## 2015-02-01 MED ORDER — METHYLERGONOVINE MALEATE 0.2 MG PO TABS
0.2000 mg | ORAL_TABLET | ORAL | Status: DC | PRN
  Administered 2015-02-01 – 2015-02-02 (×4): 0.2 mg via ORAL
  Filled 2015-02-01 (×4): qty 1

## 2015-02-01 MED ORDER — FENTANYL 2.5 MCG/ML BUPIVACAINE 1/10 % EPIDURAL INFUSION (WH - ANES)
14.0000 mL/h | INTRAMUSCULAR | Status: DC | PRN
  Administered 2015-02-01 (×2): 14 mL/h via EPIDURAL
  Filled 2015-02-01: qty 125

## 2015-02-01 MED ORDER — ONDANSETRON HCL 4 MG PO TABS: 4.0000 mg | ORAL_TABLET | ORAL | Status: DC | PRN

## 2015-02-01 MED ORDER — OXYCODONE-ACETAMINOPHEN 5-325 MG PO TABS: 2.0000 | ORAL_TABLET | ORAL | Status: DC | PRN

## 2015-02-01 MED ORDER — DIPHENHYDRAMINE HCL 25 MG PO CAPS: 25.0000 mg | ORAL_CAPSULE | Freq: Four times a day (QID) | ORAL | Status: DC | PRN

## 2015-02-01 MED ORDER — CITRIC ACID-SODIUM CITRATE 334-500 MG/5ML PO SOLN: 30.0000 mL | ORAL | Status: DC | PRN

## 2015-02-01 MED ORDER — TETANUS-DIPHTH-ACELL PERTUSSIS 5-2.5-18.5 LF-MCG/0.5 IM SUSP: 0.5000 mL | Freq: Once | INTRAMUSCULAR | Status: DC

## 2015-02-01 MED ORDER — ZOLPIDEM TARTRATE 5 MG PO TABS: 5.0000 mg | ORAL_TABLET | Freq: Every evening | ORAL | Status: DC | PRN

## 2015-02-01 MED ORDER — METHYLERGONOVINE MALEATE 0.2 MG/ML IJ SOLN: 0.2000 mg | INTRAMUSCULAR | Status: DC | PRN

## 2015-02-01 MED ORDER — WITCH HAZEL-GLYCERIN EX PADS: 1.0000 "application " | MEDICATED_PAD | CUTANEOUS | Status: DC | PRN

## 2015-02-01 MED ORDER — SENNOSIDES-DOCUSATE SODIUM 8.6-50 MG PO TABS
2.0000 | ORAL_TABLET | ORAL | Status: DC
  Administered 2015-02-01: 2 via ORAL
  Filled 2015-02-01: qty 2

## 2015-02-01 MED ORDER — LACTATED RINGERS IV SOLN
INTRAVENOUS | Status: DC
  Administered 2015-02-01 (×2): via INTRAVENOUS

## 2015-02-01 MED ORDER — ACETAMINOPHEN 325 MG PO TABS: 650.0000 mg | ORAL_TABLET | ORAL | Status: DC | PRN

## 2015-02-01 MED ORDER — ONDANSETRON HCL 4 MG/2ML IJ SOLN: 4.0000 mg | Freq: Four times a day (QID) | INTRAMUSCULAR | Status: DC | PRN

## 2015-02-01 MED ORDER — LACTATED RINGERS IV SOLN: 500.0000 mL | INTRAVENOUS | Status: DC | PRN

## 2015-02-01 MED ORDER — OXYCODONE-ACETAMINOPHEN 5-325 MG PO TABS: 1.0000 | ORAL_TABLET | ORAL | Status: DC | PRN

## 2015-02-01 MED ORDER — SIMETHICONE 80 MG PO CHEW: 80.0000 mg | CHEWABLE_TABLET | ORAL | Status: DC | PRN

## 2015-02-01 MED ORDER — BENZOCAINE-MENTHOL 20-0.5 % EX AERO
1.0000 "application " | INHALATION_SPRAY | CUTANEOUS | Status: DC | PRN
  Administered 2015-02-02: 1 via TOPICAL
  Filled 2015-02-01: qty 56

## 2015-02-01 MED ORDER — EPHEDRINE 5 MG/ML INJ
10.0000 mg | INTRAVENOUS | Status: DC | PRN
Start: 2015-02-01 — End: 2015-02-01
  Filled 2015-02-01: qty 2

## 2015-02-01 MED ORDER — OXYTOCIN 40 UNITS IN LACTATED RINGERS INFUSION - SIMPLE MED
1.0000 m[IU]/min | INTRAVENOUS | Status: DC
  Administered 2015-02-01: 2 m[IU]/min via INTRAVENOUS
  Filled 2015-02-01: qty 1000

## 2015-02-01 MED ORDER — PRENATAL MULTIVITAMIN CH
1.0000 | ORAL_TABLET | Freq: Every day | ORAL | Status: DC
  Administered 2015-02-02: 1 via ORAL
  Filled 2015-02-01: qty 1

## 2015-02-01 MED ORDER — ONDANSETRON HCL 4 MG/2ML IJ SOLN: 4.0000 mg | INTRAMUSCULAR | Status: DC | PRN

## 2015-02-01 MED ORDER — FLEET ENEMA 7-19 GM/118ML RE ENEM: 1.0000 | ENEMA | RECTAL | Status: DC | PRN

## 2015-02-01 MED ORDER — FENTANYL CITRATE (PF) 100 MCG/2ML IJ SOLN: 50.0000 ug | INTRAMUSCULAR | Status: DC | PRN

## 2015-02-01 MED ORDER — LIDOCAINE HCL (PF) 1 % IJ SOLN
30.0000 mL | INTRAMUSCULAR | Status: DC | PRN
  Filled 2015-02-01: qty 30

## 2015-02-01 MED ORDER — OXYTOCIN 40 UNITS IN LACTATED RINGERS INFUSION - SIMPLE MED: 62.5000 mL/h | INTRAVENOUS | Status: DC

## 2015-02-01 MED ORDER — LANOLIN HYDROUS EX OINT: TOPICAL_OINTMENT | CUTANEOUS | Status: DC | PRN

## 2015-02-01 MED ORDER — LIDOCAINE HCL (PF) 1 % IJ SOLN
INTRAMUSCULAR | Status: DC | PRN
  Administered 2015-02-01 (×2): 4 mL via EPIDURAL

## 2015-02-01 NOTE — Anesthesia Procedure Notes (Addendum)
Epidural Patient location during procedure: OB Start time: 02/01/2015 2:14 PM End time: 02/01/2015 2:21 PM  Staffing Anesthesiologist: Shona SimpsonHOLLIS, Jashad Depaula D Performed by: anesthesiologist   Preanesthetic Checklist Completed: patient identified, site marked, surgical consent, pre-op evaluation, timeout performed, IV checked, risks and benefits discussed and monitors and equipment checked  Epidural Patient position: sitting Prep: ChloraPrep Patient monitoring: heart rate, continuous pulse ox and blood pressure Approach: midline Location: L3-L4 Injection technique: LOR saline  Needle:  Needle type: Tuohy  Needle gauge: 17 G Needle length: 9 cm Catheter type: closed end flexible Catheter size: 20 Guage Test dose: negative and 1.5% lidocaine  Assessment Events: blood not aspirated, injection not painful, no injection resistance and no paresthesia  Additional Notes LOR @ 5  Patient identified. Risks/Benefits/Options discussed with patient including but not limited to bleeding, infection, nerve damage, paralysis, failed block, incomplete pain control, headache, blood pressure changes, nausea, vomiting, reactions to medications, itching and postpartum back pain. Confirmed with bedside nurse the patient's most recent platelet count. Confirmed with patient that they are not currently taking any anticoagulation, have any bleeding history or any family history of bleeding disorders. Patient expressed understanding and wished to proceed. All questions were answered. Sterile technique was used throughout the entire procedure. Please see nursing notes for vital signs. Test dose was given through epidural catheter and negative prior to continuing to dose epidural or start infusion. Warning signs of high block given to the patient including shortness of breath, tingling/numbness in hands, complete motor block, or any concerning symptoms with instructions to call for help. Patient was given instructions on  fall risk and not to get out of bed. All questions and concerns addressed with instructions to call with any issues or inadequate analgesia.    Reason for block:procedure for pain

## 2015-02-01 NOTE — Progress Notes (Signed)
Initial check at 1650 patient had a gush of clot-free blood. Massaged fundus - deviated to the right but firm. Patient was bladder scanned with 300 mL and urinated 250 mL. Massaged fundus when she returned to bed and was deviated to the right and boggy. Passed a moderate amount of sludge like blood. PRN methergine series started per standing orders. CNM Fredric MareBailey updated. BP at 1700 was 96/58 with HR of 66.

## 2015-02-01 NOTE — Progress Notes (Signed)
Sarah Mullins is a 29 y.o. [id number] at 3744w0d by LMP admitted for induction of labor due to Post dates. Due date 12/13.  Subjective: Feeling contractions  Objective: BP 114/73 mmHg  Pulse 87  Temp(Src) 98.1 F (36.7 C) (Oral)  Resp 20  Ht 5\' 1"  (1.549 m)  Wt 67.586 kg (149 lb)  BMI 28.17 kg/m2      FHT:  FHR: 125 bpm, variability: moderate,  accelerations:  Present,  decelerations:  Absent UC:   regular, every 3 minutes SVE:   Dilation: 3 Effacement (%): 60 Exam by:: Dr. Billy Coastaavon  Labs: Lab Results  Component Value Date   WBC 11.9* 02/01/2015   HGB 14.0 02/01/2015   HCT 41.1 02/01/2015   MCV 95.6 02/01/2015   PLT 210 02/01/2015    Assessment / Plan: Induction of labor due to postterm,  progressing well on pitocin  Labor: Progressing normally Preeclampsia:  no signs or symptoms of toxicity Fetal Wellbeing:  Category I Pain Control:  Labor support without medications I/D:  n/a Anticipated MOD:  NSVD  Sarah Mullins J 02/01/2015, 1:55 PM

## 2015-02-01 NOTE — H&P (Signed)
Madelyn BrunnerSavannah L Alexie is a 29 y.o. female presenting for postdates IOL.  Maternal Medical History:  Reason for admission: Contractions.   Contractions: Onset was less than 1 hour ago.   Perceived severity is mild.    Fetal activity: Perceived fetal activity is normal.   Last perceived fetal movement was within the past hour.    Prenatal complications: no prenatal complications Prenatal Complications - Diabetes: none.    OB History    Gravida Para Term Preterm AB TAB SAB Ectopic Multiple Living   3 2 2       2      Past Medical History  Diagnosis Date  . Raynaud's phenomenon     mild  . Instability of joint     left knee  . H/O varicella    Past Surgical History  Procedure Laterality Date  . Appendectomy    . Wisdom     Family History: family history includes Cancer in her maternal grandmother and mother; Dementia in her paternal grandfather; Heart disease in her maternal grandfather. Social History:  reports that she has never smoked. She has never used smokeless tobacco. She reports that she does not drink alcohol or use illicit drugs.   Prenatal Transfer Tool  Maternal Diabetes: No Genetic Screening: Normal Maternal Ultrasounds/Referrals: Normal Fetal Ultrasounds or other Referrals:  None Maternal Substance Abuse:  No Significant Maternal Medications:  None Significant Maternal Lab Results:  None Other Comments:  None  Review of Systems  Constitutional: Negative.   All other systems reviewed and are negative.     unknown if currently breastfeeding. Maternal Exam:  Uterine Assessment: Contraction strength is mild.  Contraction frequency is irregular.   Abdomen: Patient reports no abdominal tenderness. Fetal presentation: vertex  Introitus: Normal vulva. Normal vagina.  Ferning test: not done.  Nitrazine test: not done. Amniotic fluid character: not assessed.  Pelvis: adequate for delivery.   Cervix: Cervix evaluated by digital exam.     Physical Exam   Nursing note and vitals reviewed. Constitutional: She is oriented to person, place, and time. She appears well-developed and well-nourished.  Neck: Normal range of motion. Neck supple.  Cardiovascular: Normal rate and regular rhythm.   Respiratory: Effort normal and breath sounds normal.  GI: Soft. Bowel sounds are normal.  Genitourinary: Vagina normal and uterus normal.  Musculoskeletal: Normal range of motion.  Neurological: She is alert and oriented to person, place, and time. She has normal reflexes.  Skin: Skin is warm and dry.  Psychiatric: She has a normal mood and affect.    Prenatal labs: ABO, Rh:   Antibody:   Rubella:   RPR:    HBsAg:    HIV:    GBS:     Assessment/Plan: Postdates IOL Admit   Ziyonna Christner J 02/01/2015, 6:29 AM

## 2015-02-01 NOTE — Anesthesia Preprocedure Evaluation (Signed)
Anesthesia Evaluation  Patient identified by MRN, date of birth, ID band Patient awake    Reviewed: Allergy & Precautions, NPO status , Patient's Chart, lab work & pertinent test results  Airway Mallampati: II  TM Distance: >3 FB Neck ROM: Full    Dental  (+) Teeth Intact   Pulmonary neg pulmonary ROS,    breath sounds clear to auscultation       Cardiovascular negative cardio ROS   Rhythm:Regular Rate:Normal     Neuro/Psych negative neurological ROS  negative psych ROS   GI/Hepatic negative GI ROS, Neg liver ROS,   Endo/Other  negative endocrine ROS  Renal/GU negative Renal ROS  negative genitourinary   Musculoskeletal negative musculoskeletal ROS (+)   Abdominal   Peds negative pediatric ROS (+)  Hematology negative hematology ROS (+)   Anesthesia Other Findings - Raynaud's   Reproductive/Obstetrics (+) Pregnancy                             Lab Results  Component Value Date   WBC 11.9* 02/01/2015   HGB 14.0 02/01/2015   HCT 41.1 02/01/2015   MCV 95.6 02/01/2015   PLT 210 02/01/2015   No results found for: INR, PROTIME   Anesthesia Physical Anesthesia Plan  ASA: II  Anesthesia Plan: Epidural   Post-op Pain Management:    Induction:   Airway Management Planned:   Additional Equipment:   Intra-op Plan:   Post-operative Plan:   Informed Consent: I have reviewed the patients History and Physical, chart, labs and discussed the procedure including the risks, benefits and alternatives for the proposed anesthesia with the patient or authorized representative who has indicated his/her understanding and acceptance.     Plan Discussed with:   Anesthesia Plan Comments:         Anesthesia Quick Evaluation

## 2015-02-01 NOTE — Anesthesia Postprocedure Evaluation (Signed)
Anesthesia Post Note  Patient: Sarah BrunnerSavannah L Mullins  Procedure(s) Performed: * No procedures listed *  Patient location during evaluation: Mother Baby Anesthesia Type: Epidural Level of consciousness: awake and alert and oriented Pain management: satisfactory to patient Vital Signs Assessment: post-procedure vital signs reviewed and stable Respiratory status: spontaneous breathing and nonlabored ventilation Cardiovascular status: stable Postop Assessment: no headache, no backache, no signs of nausea or vomiting, adequate PO intake and patient able to bend at knees (patient up walking) Anesthetic complications: no    Last Vitals:  Filed Vitals:   02/01/15 1700 02/01/15 1820  BP: 98/54 119/71  Pulse: 66 71  Temp: 36.7 C 37.1 C  Resp: 18 18    Last Pain:  Filed Vitals:   02/01/15 1856  PainSc: 0-No pain                 Jamieon Lannen

## 2015-02-02 LAB — CBC
HEMATOCRIT: 40.5 % (ref 36.0–46.0)
Hemoglobin: 13.6 g/dL (ref 12.0–15.0)
MCH: 32 pg (ref 26.0–34.0)
MCHC: 33.6 g/dL (ref 30.0–36.0)
MCV: 95.3 fL (ref 78.0–100.0)
Platelets: 184 10*3/uL (ref 150–400)
RBC: 4.25 MIL/uL (ref 3.87–5.11)
RDW: 13.2 % (ref 11.5–15.5)
WBC: 15.2 10*3/uL — AB (ref 4.0–10.5)

## 2015-02-02 LAB — RPR: RPR Ser Ql: NONREACTIVE

## 2015-02-02 MED ORDER — IBUPROFEN 600 MG PO TABS: 600.0000 mg | ORAL_TABLET | Freq: Four times a day (QID) | ORAL | Status: DC

## 2015-02-02 MED ORDER — OXYCODONE-ACETAMINOPHEN 5-325 MG PO TABS: 1.0000 | ORAL_TABLET | ORAL | Status: DC | PRN

## 2015-02-02 NOTE — Lactation Note (Signed)
This note was copied from the [facility] of [facility]. Lactation Consultation Note Experienced BF mom had a 285 1/31 yr old and a 513 yr old. Mom BF both for 6 months and went back to work, then pumped at work, BF when she was home. Has good everted nipples, demonstrated great latch. Had great positioning and heard swallows. Encouraged breast massage at intervals during BF. Mom encouraged to feed baby 8-12 times/24 hours and with feeding cues. Educated about newborn behavior, reviewed cluster feeding, I&O, supply and demand. Referred to Baby and Me Book in Breastfeeding section Pg. 22-23 for position options and Proper latch demonstration. Discussed pumping for milk storage. WH/LC brochure given w/resources, support groups and LC services. Patient Name: Sarah Mullins [id number]'s Date: 02/02/2015 Reason for consult: Initial assessment   Maternal Data Has patient been taught Hand Expression?: Yes Does the patient have breastfeeding experience prior to this delivery?: Yes  Feeding Feeding Type: Breast Fed Length of feed: 10 min (still BF)  LATCH Score/Interventions Latch: Grasps breast easily, tongue down, lips flanged, rhythmical sucking.  Audible Swallowing: Spontaneous and intermittent  Type of Nipple: Everted at rest and after stimulation  Comfort (Breast/Nipple): Soft / non-tender     Hold (Positioning): No assistance needed to correctly position infant at breast.  LATCH Score: 10  Lactation Tools Discussed/Used     Consult Status Consult Status: Follow-up Date: 02/03/15 Follow-up type: In-patient    Sarah Mullins, Diamond NickelLAURA G 02/02/2015, 2:12 AM

## 2015-02-02 NOTE — Discharge Instructions (Signed)
Breast Pumping Tips °If you are breastfeeding, there may be times when you cannot feed your baby directly. Returning to work or going on a trip are common examples. Pumping allows you to store breast milk and feed it to your baby later.  °You may not get much milk when you first start to pump. Your breasts should start to make more after a few days. If you pump at the times you usually feed your baby, you may be able to keep making enough milk to feed your baby without also using formula. The more often you pump, the more milk you will produce.  °WHEN SHOULD I PUMP?  °· You can begin to pump soon after delivery. However, some experts recommend waiting about 4 weeks before giving your infant a bottle to make sure breastfeeding is going well.  °· If you plan to return to work, begin pumping a few weeks before. This will help you develop techniques that work best for you. It also lets you build up a supply of breast milk.   °· When you are with your infant, feed on demand and pump after each feeding.   °· When you are away from your infant for several hours, pump for about 15 minutes every 2-3 hours. Pump both breasts at the same time if you can.   °· If your infant has a formula feeding, make sure to pump around the same time.     °· If you drink any alcohol, wait 2 hours before pumping.   °HOW DO I PREPARE TO PUMP? °Your let-down reflex is the natural reaction to stimulation that makes your breast milk flow. It is easier to stimulate this reflex when you are relaxed. Find relaxation techniques that work for you. If you have difficulty with your let-down reflex, try these methods:  °· Smell one of your infant's blankets or an item of clothing.   °· Look at a picture or video of your infant.   °· Sit in a quiet, private space.   °· Massage the breast you plan to pump.   °· Place soothing warmth on the breast.   °· Play relaxing music.   °WHAT ARE SOME GENERAL BREAST PUMPING TIPS? °· Wash your hands before you pump. You  do not need to wash your nipples or breasts. °· There are three ways to pump. °· You can use your hand to massage and compress your breast. °· You can use a handheld manual pump. °· You can use an electric pump.   °· Make sure the suction cup (flange) on the breast pump is the right size. Place the flange directly over the nipple. If it is the wrong size or placed the wrong way, it may be painful and cause nipple damage.   °· If pumping is uncomfortable, apply a small amount of purified or modified lanolin to your nipple and areola. °· If you are using an electric pump, adjust the speed and suction power to be more comfortable. °· If pumping is painful or if you are not getting very much milk, you may need a different type of pump. A lactation consultant can help you determine what type of pump to use.   °· Keep a full water bottle near you at all times. Drinking lots of fluid helps you make more milk.  °· You can store your milk to use later. Pumped breast milk can be stored in a sealable, sterile container or plastic bag. Label all stored breast milk with the date you pumped it. °· Milk can stay out at room temperature for up to 8 hours. °·   You can store your milk in the refrigerator for up to 8 days. °· You can store your milk in the freezer for 3 months. Thaw frozen milk using warm water. Do not put it in the microwave. °· Do not smoke. Smoking can lower your milk supply and harm your infant. If you need help quitting, ask your health care provider to recommend a program.   °WHEN SHOULD I CALL MY HEALTH CARE PROVIDER OR A LACTATION CONSULTANT? °· You are having trouble pumping. °· You are concerned that you are not making enough milk. °· You have nipple pain, soreness, or redness. °· You want to use birth control. Birth control pills may lower your milk supply. Talk to your health care provider about your options. °  °This information is not intended to replace advice given to you by your health care provider.  Make sure you discuss any questions you have with your health care provider. °  °Document Released: 07/19/2009 Document Revised: 02/03/2013 Document Reviewed: 11/21/2012 °[employer]vier Interactive Patient Education ©2016 [employer] °Postpartum Depression and Baby Blues °The postpartum period begins right after the birth of a baby. During this time, there is often a great amount of joy and excitement. It is also a time of many changes in the life of the parents. Regardless of how many times a mother gives birth, each child brings new challenges and dynamics to the family. It is not unusual to have feelings of excitement along with confusing shifts in moods, emotions, and thoughts. All mothers are at risk of developing postpartum depression or the "baby blues." These mood changes can occur right after giving birth, or they may occur many months after giving birth. The baby blues or postpartum depression can be mild or severe. Additionally, postpartum depression can go away rather quickly, or it can be a long-term condition.  °CAUSES °Raised hormone levels and the rapid drop in those levels are thought to be a main cause of postpartum depression and the baby blues. A number of hormones change during and after pregnancy. Estrogen and progesterone usually decrease right after the delivery of your baby. The levels of thyroid hormone and various cortisol steroids also rapidly drop. Other factors that play a role in these mood changes include major life events and genetics.  °RISK FACTORS °If you have any of the following risks for the baby blues or postpartum depression, know what symptoms to watch out for during the postpartum period. Risk factors that may increase the likelihood of getting the baby blues or postpartum depression include: °· Having a personal or family history of depression.   °· Having depression while being pregnant.   °· Having premenstrual mood issues or mood issues related to oral  contraceptives. °· Having a lot of life stress.   °· Having marital conflict.   °· Lacking a social support network.   °· Having a baby with special needs.   °· Having health problems, such as diabetes.   °SIGNS AND SYMPTOMS °Symptoms of baby blues include: °· Brief changes in mood, such as going from extreme happiness to sadness. °· Decreased concentration.   °· Difficulty sleeping.   °· Crying spells, tearfulness.   °· Irritability.   °· Anxiety.   °Symptoms of postpartum depression typically begin within the first month after giving birth. These symptoms include: °· Difficulty sleeping or excessive sleepiness.   °· Marked weight loss.   °· Agitation.   °· Feelings of worthlessness.   °· Lack of interest in activity or food.   °Postpartum psychosis is a very serious condition and can be dangerous. Fortunately, it is   rare. Displaying any of the following symptoms is cause for immediate medical attention. Symptoms of postpartum psychosis include:  °· Hallucinations and delusions.   °· Bizarre or disorganized behavior.   °· Confusion or disorientation.   °DIAGNOSIS  °A diagnosis is made by an evaluation of your symptoms. There are no medical or lab tests that lead to a diagnosis, but there are various questionnaires that a health care provider may use to identify those with the baby blues, postpartum depression, or psychosis. Often, a screening tool called the Edinburgh Postnatal Depression Scale is used to diagnose depression in the postpartum period.  °TREATMENT °The baby blues usually goes away on its own in 1-2 weeks. Social support is often all that is needed. You will be encouraged to get adequate sleep and rest. Occasionally, you may be given medicines to help you sleep.  °Postpartum depression requires treatment because it can last several months or longer if it is not treated. Treatment may include individual or group therapy, medicine, or both to address any social, physiological, and psychological factors  that may play a role in the depression. Regular exercise, a healthy diet, rest, and social support may also be strongly recommended.  °Postpartum psychosis is more serious and needs treatment right away. Hospitalization is often needed. °HOME CARE INSTRUCTIONS °· Get as much rest as you can. Nap when the baby sleeps.   °· Exercise regularly. Some women find yoga and walking to be beneficial.   °· Eat a balanced and nourishing diet.   °· Do little things that you enjoy. Have a cup of tea, take a bubble bath, read your favorite magazine, or listen to your favorite music. °· Avoid alcohol.   °· Ask for help with household chores, cooking, grocery shopping, or running errands as needed. Do not try to do everything.   °· Talk to people close to you about how you are feeling. Get support from your partner, family members, friends, or other new moms. °· Try to stay positive in how you think. Think about the things you are grateful for.   °· Do not spend a lot of time alone.   °· Only take over-the-counter or prescription medicine as directed by your health care provider. °· Keep all your postpartum appointments.   °· Let your health care provider know if you have any concerns.   °SEEK MEDICAL CARE IF: °You are having a reaction to or problems with your medicine. °SEEK IMMEDIATE MEDICAL CARE IF: °· You have suicidal feelings.   °· You think you may harm the baby or someone else. °MAKE SURE YOU: °· Understand these instructions. °· Will watch your condition. °· Will get help right away if you are not doing well or get worse. °  °This information is not intended to replace advice given to you by your health care provider. Make sure you discuss any questions you have with your health care provider. °  °Document Released: 11/03/2003 Document Revised: 02/03/2013 Document Reviewed: 11/10/2012 °Elsevier Interactive Patient Education ©2016 [employer] °Postpartum Care After Vaginal Delivery °After you deliver your newborn  (postpartum period), the usual stay in the hospital is 24-72 hours. If there were problems with your labor or delivery, or if you have other medical problems, you might be in the hospital longer.  °While you are in the hospital, you will receive help and instructions on how to care for yourself and your newborn during the postpartum period.  °While you are in the hospital: °· Be sure to tell your nurses if you have pain or discomfort, as well as   where you feel the pain and what makes the pain worse. °· If you had an incision made near your vagina (episiotomy) or if you had some tearing during delivery, the nurses may put ice packs on your episiotomy or tear. The ice packs may help to reduce the pain and swelling. °· If you are breastfeeding, you may feel uncomfortable contractions of your uterus for a couple of weeks. This is normal. The contractions help your uterus get back to normal size. °· It is normal to have some bleeding after delivery. °· For the first 1-3 days after delivery, the flow is red and the amount may be similar to a period. °· It is common for the flow to start and stop. °· In the first few days, you may pass some small clots. Let your nurses know if you begin to pass large clots or your flow increases. °· Do not  flush blood clots down the toilet before having the nurse look at them. °· During the next 3-10 days after delivery, your flow should become more watery and pink or brown-tinged in color. °· Ten to fourteen days after delivery, your flow should be a small amount of yellowish-white discharge. °· The amount of your flow will decrease over the first few weeks after delivery. Your flow may stop in 6-8 weeks. Most women have had their flow stop by 12 weeks after delivery. °· You should change your sanitary pads frequently. °· Wash your hands thoroughly with soap and water for at least 20 seconds after changing pads, using the toilet, or before holding or feeding your newborn. °· You should  feel like you need to empty your bladder within the first 6-8 hours after delivery. °· In case you become weak, lightheaded, or faint, call your nurse before you get out of bed for the first time and before you take a shower for the first time. °· Within the first few days after delivery, your breasts may begin to feel tender and full. This is called engorgement. Breast tenderness usually goes away within 48-72 hours after engorgement occurs. You may also notice milk leaking from your breasts. If you are not breastfeeding, do not stimulate your breasts. Breast stimulation can make your breasts produce more milk. °· Spending as much time as possible with your newborn is very important. During this time, you and your newborn can feel close and get to know each other. Having your newborn stay in your room (rooming in) will help to strengthen the bond with your newborn.  It will give you time to get to know your newborn and become comfortable caring for your newborn. °· Your hormones change after delivery. Sometimes the hormone changes can temporarily cause you to feel sad or tearful. These feelings should not last more than a few days. If these feelings last longer than that, you should talk to your caregiver. °· If desired, talk to your caregiver about methods of family planning or contraception. °· Talk to your caregiver about immunizations. Your caregiver may want you to have the following immunizations before leaving the hospital: °· Tetanus, diphtheria, and pertussis (Tdap) or tetanus and diphtheria (Td) immunization. It is very important that you and your family (including grandparents) or others caring for your newborn are up-to-date with the Tdap or Td immunizations. The Tdap or Td immunization can help protect your newborn from getting ill. °· Rubella immunization. °· Varicella (chickenpox) immunization. °· Influenza immunization. You should receive this annual immunization if you did not receive the    immunization during your pregnancy. °  °This information is not intended to replace advice given to you by your health care provider. Make sure you discuss any questions you have with your health care provider. °  °Document Released: 11/26/2006 Document Revised: 10/24/2011 Document Reviewed: 09/26/2011 °[employer]vier Interactive Patient Education ©2016 [employer] °Breastfeeding °Deciding to breastfeed is one of the best choices you can make for you and your baby. A change in hormones during pregnancy causes your breast tissue to grow and increases the number and size of your milk ducts. These hormones also allow proteins, sugars, and fats from your blood supply to make breast milk in your milk-producing glands. Hormones prevent breast milk from being released before your baby is born as well as prompt milk flow after birth. Once breastfeeding has begun, thoughts of your baby, as well as his or her sucking or crying, can stimulate the release of milk from your milk-producing glands.  °BENEFITS OF BREASTFEEDING °For Your Baby °· Your first milk (colostrum) helps your baby's digestive system function better. °· There are antibodies in your milk that help your baby fight off infections. °· Your baby has a lower incidence of asthma, allergies, and sudden infant death syndrome. °· The nutrients in breast milk are better for your baby than infant formulas and are designed uniquely for your baby's needs. °· Breast milk improves your baby's brain development. °· Your baby is less likely to develop other conditions, such as childhood obesity, asthma, or type 2 diabetes mellitus. °For You °· Breastfeeding helps to create a very special bond between you and your baby. °· Breastfeeding is convenient. Breast milk is always available at the correct temperature and costs nothing. °· Breastfeeding helps to burn calories and helps you lose the weight gained during pregnancy. °· Breastfeeding makes your uterus contract to its  prepregnancy size faster and slows bleeding (lochia) after you give birth.   °· Breastfeeding helps to lower your risk of developing type 2 diabetes mellitus, osteoporosis, and breast or ovarian cancer later in life. °SIGNS THAT YOUR BABY IS HUNGRY °Early Signs of Hunger °· Increased alertness or activity. °· Stretching. °· Movement of the head from side to side. °· Movement of the head and opening of the mouth when the corner of the mouth or cheek is stroked (rooting). °· Increased sucking sounds, smacking lips, cooing, sighing, or squeaking. °· Hand-to-mouth movements. °· Increased sucking of fingers or hands. °Late Signs of Hunger °· Fussing. °· Intermittent crying. °Extreme Signs of Hunger °Signs of extreme hunger will require calming and consoling before your baby will be able to breastfeed successfully. Do not wait for the following signs of extreme hunger to occur before you initiate breastfeeding: °· Restlessness. °· A loud, strong cry. °· Screaming. °BREASTFEEDING BASICS °Breastfeeding Initiation °· Find a comfortable place to sit or lie down, with your neck and back well supported. °· Place a pillow or rolled up blanket under your baby to bring him or her to the level of your breast (if you are seated). Nursing pillows are specially designed to help support your arms and your baby while you breastfeed. °· Make sure that your baby's abdomen is facing your abdomen. °· Gently massage your breast. With your fingertips, massage from your chest wall toward your nipple in a circular motion. This encourages milk flow. You may need to continue this action during the feeding if your milk flows slowly. °· Support your breast with 4 fingers underneath and your thumb above your nipple. Make sure   your fingers are well away from your nipple and your baby's mouth. °· Stroke your baby's lips gently with your finger or nipple. °· When your baby's mouth is open wide enough, quickly bring your baby to your breast, placing  your entire nipple and as much of the colored area around your nipple (areola) as possible into your baby's mouth. °· More areola should be visible above your baby's upper lip than below the lower lip. °· Your baby's tongue should be between his or her lower gum and your breast. °· Ensure that your baby's mouth is correctly positioned around your nipple (latched). Your baby's lips should create a seal on your breast and be turned out (everted). °· It is common for your baby to suck about 2-3 minutes in order to start the flow of breast milk. °Latching °Teaching your baby how to latch on to your breast properly is very important. An improper latch can cause nipple pain and decreased milk supply for you and poor weight gain in your baby. Also, if your baby is not latched onto your nipple properly, he or she may swallow some air during feeding. This can make your baby fussy. Burping your baby when you switch breasts during the feeding can help to get rid of the air. However, teaching your baby to latch on properly is still the best way to prevent fussiness from swallowing air while breastfeeding. °Signs that your baby has successfully latched on to your nipple: °· Silent tugging or silent sucking, without causing you pain. °· Swallowing heard between every 3-4 sucks. °· Muscle movement above and in front of his or her ears while sucking. °Signs that your baby has not successfully latched on to nipple: °· Sucking sounds or smacking sounds from your baby while breastfeeding. °· Nipple pain. °If you think your baby has not latched on correctly, slip your finger into the corner of your baby's mouth to break the suction and place it between your baby's gums. Attempt breastfeeding initiation again. °Signs of Successful Breastfeeding °Signs from your baby: °· A gradual decrease in the number of sucks or complete cessation of sucking. °· Falling asleep. °· Relaxation of his or her body. °· Retention of a small amount of milk  in his or her mouth. °· Letting go of your breast by himself or herself. °Signs from you: °· Breasts that have increased in firmness, weight, and size 1-3 hours after feeding. °· Breasts that are softer immediately after breastfeeding. °· Increased milk volume, as well as a change in milk consistency and color by the fifth day of breastfeeding. °· Nipples that are not sore, cracked, or bleeding. °Signs That Your Baby is Getting Enough Milk °· Wetting at least 3 diapers in a 24-hour period. The urine should be clear and pale yellow by age 5 days. °· At least 3 stools in a 24-hour period by age 5 days. The stool should be soft and yellow. °· At least 3 stools in a 24-hour period by age 7 days. The stool should be seedy and yellow. °· No loss of weight greater than 10% of birth weight during the first 3 days of age. °· Average weight gain of 4-7 ounces (113-198 g) per week after age 4 days. °· Consistent daily weight gain by age 5 days, without weight loss after the age of 2 weeks. °After a feeding, your baby may spit up a small amount. This is common. °BREASTFEEDING FREQUENCY AND DURATION °Frequent feeding will help you make more milk   and can prevent sore nipples and breast engorgement. Breastfeed when you feel the need to reduce the fullness of your breasts or when your baby shows signs of hunger. This is called "breastfeeding on demand." Avoid introducing a pacifier to your baby while you are working to establish breastfeeding (the first 4-6 weeks after your baby is born). After this time you may choose to use a pacifier. Research has shown that pacifier use during the first year of a baby's life decreases the risk of sudden infant death syndrome (SIDS). °Allow your baby to feed on each breast as long as he or she wants. Breastfeed until your baby is finished feeding. When your baby unlatches or falls asleep while feeding from the first breast, offer the second breast. Because newborns are often sleepy in the first  few weeks of life, you may need to awaken your baby to get him or her to feed. °Breastfeeding times will vary from baby to baby. However, the following rules can serve as a guide to help you ensure that your baby is properly fed: °· Newborns (babies 4 weeks of age or younger) may breastfeed every 1-3 hours. °· Newborns should not go longer than 3 hours during the day or 5 hours during the night without breastfeeding. °· You should breastfeed your baby a minimum of 8 times in a 24-hour period until you begin to introduce solid foods to your baby at around 6 months of age. °BREAST MILK PUMPING °Pumping and storing breast milk allows you to ensure that your baby is exclusively fed your breast milk, even at times when you are unable to breastfeed. This is especially important if you are going back to work while you are still breastfeeding or when you are not able to be present during feedings. Your lactation consultant can give you guidelines on how long it is safe to store breast milk. °A breast pump is a machine that allows you to pump milk from your breast into a sterile bottle. The pumped breast milk can then be stored in a refrigerator or freezer. Some breast pumps are operated by hand, while others use electricity. Ask your lactation consultant which type will work best for you. Breast pumps can be purchased, but some hospitals and breastfeeding support groups lease breast pumps on a monthly basis. A lactation consultant can teach you how to hand express breast milk, if you prefer not to use a pump. °CARING FOR YOUR BREASTS WHILE YOU BREASTFEED °Nipples can become dry, cracked, and sore while breastfeeding. The following recommendations can help keep your breasts moisturized and healthy: °· Avoid using soap on your nipples. °· Wear a supportive bra. Although not required, special nursing bras and tank tops are designed to allow access to your breasts for breastfeeding without taking off your entire bra or top.  Avoid wearing underwire-style bras or extremely tight bras. °· Air dry your nipples for 3-4 minutes after each feeding. °· Use only cotton bra pads to absorb leaked breast milk. Leaking of breast milk between feedings is normal. °· Use lanolin on your nipples after breastfeeding. Lanolin helps to maintain your skin's normal moisture barrier. If you use pure lanolin, you do not need to wash it off before feeding your baby again. Pure lanolin is not toxic to your baby. You may also hand express a few drops of breast milk and gently massage that milk into your nipples and allow the milk to air dry. °In the first few weeks after giving birth, some women experience   extremely full breasts (engorgement). Engorgement can make your breasts feel heavy, warm, and tender to the touch. Engorgement peaks within 3-5 days after you give birth. The following recommendations can help ease engorgement:  Completely empty your breasts while breastfeeding or pumping. You may want to start by applying warm, moist heat (in the shower or with warm water-soaked hand towels) just before feeding or pumping. This increases circulation and helps the milk flow. If your baby does not completely empty your breasts while breastfeeding, pump any extra milk after he or she is finished.  Wear a snug bra (nursing or regular) or tank top for 1-2 days to signal your body to slightly decrease milk production.  Apply ice packs to your breasts, unless this is too uncomfortable for you.  Make sure that your baby is latched on and positioned properly while breastfeeding. If engorgement persists after 48 hours of following these recommendations, contact your health care provider or a Advertising copywriter. OVERALL HEALTH CARE RECOMMENDATIONS WHILE BREASTFEEDING  Eat healthy foods. Alternate between meals and snacks, eating 3 of each per day. Because what you eat affects your breast milk, some of the foods may make your baby more irritable than usual.  Avoid eating these foods if you are sure that they are negatively affecting your baby.  Drink milk, fruit juice, and water to satisfy your thirst (about 10 glasses a day).  Rest often, relax, and continue to take your prenatal vitamins to prevent fatigue, stress, and anemia.  Continue breast self-awareness checks.  Avoid chewing and smoking tobacco. Chemicals from cigarettes that pass into breast milk and exposure to secondhand smoke may harm your baby.  Avoid alcohol and drug use, including marijuana. Some medicines that may be harmful to your baby can pass through breast milk. It is important to ask your health care provider before taking any medicine, including all over-the-counter and prescription medicine as well as vitamin and herbal supplements. It is possible to become pregnant while breastfeeding. If birth control is desired, ask your health care provider about options that will be safe for your baby. SEEK MEDICAL CARE IF:  You feel like you want to stop breastfeeding or have become frustrated with breastfeeding.  You have painful breasts or nipples.  Your nipples are cracked or bleeding.  Your breasts are red, tender, or warm.  You have a swollen area on either breast.  You have a fever or chills.  You have nausea or vomiting.  You have drainage other than breast milk from your nipples.  Your breasts do not become full before feedings by the fifth day after you give birth.  You feel sad and depressed.  Your baby is too sleepy to eat well.  Your baby is having trouble sleeping.   Your baby is wetting less than 3 diapers in a 24-hour period.  Your baby has less than 3 stools in a 24-hour period.  Your baby's skin or the white part of his or her eyes becomes yellow.   Your baby is not gaining weight by 15 days of age. SEEK IMMEDIATE MEDICAL CARE IF:  Your baby is overly tired (lethargic) and does not want to wake up and feed.  Your baby develops an  unexplained fever.   This information is not intended to replace advice given to you by your health care provider. Make sure you discuss any questions you have with your health care provider.   Document Released: 01/29/2005 Document Revised: 10/20/2014 Document Reviewed: 07/23/2012 Elsevier Interactive Patient Education  2016 Elsevier Inc. ° °

## 2015-02-02 NOTE — Discharge Summary (Signed)
OB Discharge Summary     Patient Name: Sarah Mullins DOB: 21-Feb-1985 MRN: [id number]  Date of admission: 02/01/2015 Delivering MD: Olivia Mackie   Date of discharge: 02/02/2015  Admitting diagnosis: INDUCTION Intrauterine pregnancy: [redacted]w[redacted]d     Secondary diagnosis:  Principal Problem:   Postpartum care following vaginal delivery (12/20) Active Problems:   Personal history of previous postdates pregnancy  Additional problems: none     Discharge diagnosis: Postdates Pregnancy - delivered                                                                                                Post partum procedures:none  Augmentation: AROM and Pitocin  Complications: None  Hospital course:  Induction of Labor With Vaginal Delivery   29 y.o. yo [phone] at [redacted]w[redacted]d was admitted to the hospital 02/01/2015 for induction of labor.  Indication for induction: Postdates.  Patient had an uncomplicated labor course as follows: Membrane Rupture Time/Date: 10:43 AM ,02/01/2015   Intrapartum Procedures: Episiotomy: None [1]                                         Lacerations:  Periurethral [8]  Patient had delivery of a Viable infant.  Information for the patient's newborn:  Tonea, Leiphart [[id number]]  Delivery Method: Vaginal, Spontaneous Delivery (Filed from Delivery Summary)   02/01/2015  Details of delivery can be found in separate delivery note.  Patient had a routine postpartum course. Patient is discharged home 02/02/2015  6:35 PM    Physical exam  Filed Vitals:   02/01/15 1700 02/01/15 1820 02/01/15 2145 02/02/15 0632  BP: 98/54 119/71 114/70 99/60  Pulse: 66 71 70 64  Temp: 98 F (36.7 C) 98.8 F (37.1 C) 98.4 F (36.9 C) 98.2 F (36.8 C)  TempSrc: Oral Oral Oral Oral  Resp: Height:      Weight:       General: alert, cooperative and no distress Lochia: appropriate Uterine Fundus: firm Incision: N/A DVT Evaluation: No evidence of DVT seen on  physical exam. Negative Homan's sign. No cords or calf tenderness. No significant calf/ankle edema. Labs: Lab Results  Component Value Date   WBC 15.2* 02/02/2015   HGB 13.6 02/02/2015   HCT 40.5 02/02/2015   MCV 95.3 02/02/2015   PLT 184 02/02/2015   No flowsheet data found.  Discharge instruction: per After Visit Summary and "Baby and Me Booklet".  After visit meds:    Medication List    STOP taking these medications        Evening Primrose Oil 1000 MG Caps     OVER THE COUNTER MEDICATION      TAKE these medications        flintstones complete 60 MG chewable tablet  Chew 1 tablet by mouth daily.     FOLIC ACID PO  Take 1 tablet by mouth daily.     ibuprofen 600 MG tablet  Commonly known as:  ADVIL,MOTRIN  Take 1  tablet (600 mg total) by mouth every 6 (six) hours.     oxyCODONE-acetaminophen 5-325 MG tablet  Commonly known as:  PERCOCET/ROXICET  Take 1 tablet by mouth every 4 (four) hours as needed (for pain scale 4-7).     STOOL SOFTENER PO  Take 1 tablet by mouth daily. 100mg         Diet: routine diet  Activity: Advance as tolerated. Pelvic rest for 6 weeks.   Outpatient follow up:6 weeks Follow up Appt:No future appointments. Follow up Visit:No Follow-up on file.  Postpartum contraception: Undecided  Newborn Data: Live born female  Birth Weight: 7 lb 14.2 oz (3578 g) APGAR: 9, 9  Baby Feeding: Breast Disposition:home with mother   02/02/2015 Raelyn MoraAWSON, Ondre Salvetti, Judie PetitM, CNM

## 2015-02-02 NOTE — Progress Notes (Signed)
Patient ID: Sarah Mullins, female   DOB: Mar 26, 1985, 29 y.o.   MRN: [id number] PPD # 1 SVD  S:  Reports feeling well. Desires early discharge.             Tolerating po/ No nausea or vomiting             Bleeding is light             Pain controlled with ibuprofen (OTC) and narcotic analgesics including Percocet             Up ad lib / ambulatory / voiding without difficulties    Newborn  Information for the patient's newborn:  [name], [name] [[id number]][[id number]]  female  breast feeding   O:  A & O x 3, in no apparent distress              VS:  Filed Vitals:   02/01/15 1700 02/01/15 1820 02/01/15 2145 02/02/15 0632  BP: 98/54 119/71 114/70 99/60  Pulse: 66 71 70 64  Temp: 98 F (36.7 C) 98.8 F (37.1 C) 98.4 F (36.9 C) 98.2 F (36.8 C)  TempSrc: Oral Oral Oral Oral  Resp: 18 18 16 18   Height:      Weight:        LABS:  Recent Labs  02/01/15 1040 02/02/15 0600  WBC 11.9* 15.2*  HGB 14.0 13.6  HCT 41.1 40.5  PLT 210 184    Blood type: O POS  Rubella: Immune    Lungs: Clear and unlabored  Heart: regular rate and rhythm / no murmurs  Abdomen: soft, non-tender, non-distended             Fundus: firm, non-tender, U-2  Perineum: periurethral repair healing well  Lochia: minimal  Extremities: No edema, no calf pain or tenderness, No Homans    A/P: PPD # 1  28 y.o., [id number]   Principal Problem:    Postpartum care following vaginal delivery (12/20)  Active Problems:    Personal history of previous postdates pregnancy   Doing well - stable status  Routine post partum orders  Early discharge home today    Arneshia Ade, M, MSN, CNM 02/02/2015, 9:30 AM

## 2015-12-17 DIAGNOSIS — R05 Cough: Secondary | ICD-10-CM | POA: Diagnosis not present

## 2015-12-17 DIAGNOSIS — R51 Headache: Secondary | ICD-10-CM | POA: Diagnosis not present

## 2015-12-17 DIAGNOSIS — J0140 Acute pansinusitis, unspecified: Secondary | ICD-10-CM | POA: Diagnosis not present

## 2015-12-17 DIAGNOSIS — Z20818 Contact with and (suspected) exposure to other bacterial communicable diseases: Secondary | ICD-10-CM | POA: Diagnosis not present

## 2016-02-13 NOTE — L&D Delivery Note (Signed)
Delivery Note At 2:48 PM a viable and healthy female was delivered via  (Presentation:LOA ).  APGAR: 8, 9; weight  pending.   Placenta status: spontaneous, intact.  Cord:  with the following complications: none.  Cord pH: na  Anesthesia:  epidural Episiotomy:  na Lacerations:  na Suture Repair: na Est. Blood Loss (mL):  100  Mom to postpartum.  Baby to Couplet care / Skin to Skin.  Sarah Mullins J 01/30/2017, 2:56 PM

## 2016-02-14 DIAGNOSIS — O09291 Supervision of pregnancy with other poor reproductive or obstetric history, first trimester: Secondary | ICD-10-CM | POA: Diagnosis not present

## 2016-02-14 DIAGNOSIS — Z3201 Encounter for pregnancy test, result positive: Secondary | ICD-10-CM | POA: Diagnosis not present

## 2016-02-14 DIAGNOSIS — Z3A01 Less than 8 weeks gestation of pregnancy: Secondary | ICD-10-CM | POA: Diagnosis not present

## 2016-02-16 DIAGNOSIS — Z3A01 Less than 8 weeks gestation of pregnancy: Secondary | ICD-10-CM | POA: Diagnosis not present

## 2016-02-16 DIAGNOSIS — O09291 Supervision of pregnancy with other poor reproductive or obstetric history, first trimester: Secondary | ICD-10-CM | POA: Diagnosis not present

## 2016-02-20 DIAGNOSIS — Z3A01 Less than 8 weeks gestation of pregnancy: Secondary | ICD-10-CM | POA: Diagnosis not present

## 2016-02-20 DIAGNOSIS — O09291 Supervision of pregnancy with other poor reproductive or obstetric history, first trimester: Secondary | ICD-10-CM | POA: Diagnosis not present

## 2016-02-27 DIAGNOSIS — Z3A01 Less than 8 weeks gestation of pregnancy: Secondary | ICD-10-CM | POA: Diagnosis not present

## 2016-02-27 DIAGNOSIS — O031 Delayed or excessive hemorrhage following incomplete spontaneous abortion: Secondary | ICD-10-CM | POA: Diagnosis not present

## 2016-03-20 DIAGNOSIS — Z01419 Encounter for gynecological examination (general) (routine) without abnormal findings: Secondary | ICD-10-CM | POA: Diagnosis not present

## 2016-03-20 DIAGNOSIS — Z23 Encounter for immunization: Secondary | ICD-10-CM | POA: Diagnosis not present

## 2016-03-20 DIAGNOSIS — Z6822 Body mass index (BMI) 22.0-22.9, adult: Secondary | ICD-10-CM | POA: Diagnosis not present

## 2016-03-22 ENCOUNTER — Ambulatory Visit (INDEPENDENT_AMBULATORY_CARE_PROVIDER_SITE_OTHER): Payer: BC Managed Care – PPO | Admitting: Physician Assistant

## 2016-03-22 ENCOUNTER — Encounter: Payer: Self-pay | Admitting: Physician Assistant

## 2016-03-22 DIAGNOSIS — J020 Streptococcal pharyngitis: Secondary | ICD-10-CM | POA: Diagnosis not present

## 2016-03-22 DIAGNOSIS — R5383 Other fatigue: Secondary | ICD-10-CM

## 2016-03-22 DIAGNOSIS — J029 Acute pharyngitis, unspecified: Secondary | ICD-10-CM | POA: Diagnosis not present

## 2016-03-22 LAB — POCT CBC
GRANULOCYTE PERCENT: 57.8 % (ref 37–80)
HCT, POC: 38 % (ref 37.7–47.9)
Hemoglobin: 13.4 g/dL (ref 12.2–16.2)
Lymph, poc: 1.9 (ref 0.6–3.4)
MCH: 31.4 pg — AB (ref 27–31.2)
MCHC: 35.4 g/dL (ref 31.8–35.4)
MCV: 88.9 fL (ref 80–97)
MID (CBC): 0.4 (ref 0–0.9)
MPV: 8.3 fL (ref 0–99.8)
PLATELET COUNT, POC: 193 10*3/uL (ref 142–424)
POC GRANULOCYTE: 3.2 (ref 2–6.9)
POC LYMPH %: 34.1 % (ref 10–50)
POC MID %: 8.1 %M (ref 0–12)
RBC: 4.27 M/uL (ref 4.04–5.48)
RDW, POC: 12.6 %
WBC: 5.5 10*3/uL (ref 4.6–10.2)

## 2016-03-22 LAB — POCT RAPID STREP A (OFFICE): Rapid Strep A Screen: NEGATIVE

## 2016-03-22 MED ORDER — MAGIC MOUTHWASH W/LIDOCAINE: 10.0000 mL | ORAL | 0 refills | Status: DC | PRN

## 2016-03-22 NOTE — Patient Instructions (Addendum)
I will contact you with your lab results when they are complete. In the meantime, continue salt water gargles and tylenol as needed for pain. You can also use the magic mouthwash as needed for pain. I recommend eating soup and smoothies to avoid further throat irritation. If any symptoms worsen, or you develop new fever or chills seek care immediately. Thank you for letting me participate in your health and well being.   Sore Throat When you have a sore throat, your throat may:  Hurt.  Burn.  Feel irritated.  Feel scratchy. Many things can cause a sore throat, including:  An infection.  Allergies.  Dryness in the air.  Smoke or pollution.  Gastroesophageal reflux disease (GERD).  A tumor. A sore throat can be the first sign of another sickness. It can happen with other problems, like coughing or a fever. Most sore throats go away without treatment. Follow these instructions at home:  Take over-the-counter medicines only as told by your doctor.  Drink enough fluids to keep your pee (urine) clear or pale yellow.  Rest when you feel you need to.  To help with pain, try:  Sipping warm liquids, such as broth, herbal tea, or warm water.  Eating or drinking cold or frozen liquids, such as frozen ice pops.  Gargling with a salt-water mixture 3-4 times a day or as needed. To make a salt-water mixture, add -1 tsp of salt in 1 cup of warm water. Mix it until you cannot see the salt anymore.  Sucking on hard candy or throat lozenges.  Putting a cool-mist humidifier in your bedroom at night.  Sitting in the bathroom with the door closed for 5-10 minutes while you run hot water in the shower.  Do not use any tobacco products, such as cigarettes, chewing tobacco, and e-cigarettes. If you need help quitting, ask your doctor. Contact a doctor if:  You have a fever for more than 2-3 days.  You keep having symptoms for more than 2-3 days.  Your throat does not get better in 7  days.  You have a fever and your symptoms suddenly get worse. Get help right away if:  You have trouble breathing.  You cannot swallow fluids, soft foods, or your saliva.  You have swelling in your throat or neck that gets worse.  You keep feeling like you are going to throw up (vomit).  You keep throwing up. This information is not intended to replace advice given to you by your health care provider. Make sure you discuss any questions you have with your health care provider. Document Released: 11/08/2007 Document Revised: 09/25/2015 Document Reviewed: 11/19/2014 Elsevier Interactive Patient Education  2017 [employer]

## 2016-03-22 NOTE — Progress Notes (Signed)
MRN: [id number] DOB: 1985/11/22  Subjective:   Sarah Mullins is a 31 y.o. female presenting for chief complaint of Sore Throat (Onset last night/ work at [employer]) .  Reports 2 day history of left sided sore throat. Has associated extreme fatigue, headache, hoarse voice, and left ear pain.  Was sick one week ago with sinus infection which seemed to get better but then 2 days ago developed terrible sore throat. Has tried tylenol with moderate relief. Denies fever, rhinorrhea, itchy watery eyes and cough, nausea, vomiting, abdominal pain and diarrhea. Has had sick contact with preschoolers and 3 young children at home. No history of seasonal allergies, history of asthma. Patient has had flu shot this season. Denies smoking,occasional alcohol use. Denies any other aggravating or relieving factors, no other questions or concerns.  Sarah Mullins has a current medication list which includes the following prescription(s): magic mouthwash w/lidocaine. Also is allergic to beta adrenergic blockers.  Sarah Mullins  has a past medical history of H/O varicella; Instability of joint; and Raynaud's phenomenon. Also  has a past surgical history that includes Appendectomy and wisdom.   Objective:   Vitals: There were no vitals taken for this visit.  Physical Exam  Constitutional: She is oriented to person, place, and time. She appears well-developed and well-nourished. She appears distressed (mild, appears tired).  HENT:  Head: Normocephalic and atraumatic.  Right Ear: Tympanic membrane, external ear and ear canal normal.  Left Ear: Tympanic membrane, external ear and ear canal normal.  Nose: Right sinus exhibits maxillary sinus tenderness (mild). Right sinus exhibits no frontal sinus tenderness. Left sinus exhibits maxillary sinus tenderness (mild). Left sinus exhibits no frontal sinus tenderness.  Mouth/Throat: Uvula is midline and mucous membranes are normal. Posterior oropharyngeal erythema (  moderate) present. Tonsils are 1+ on the right. Tonsils are 1+ on the left. No tonsillar exudate.  Eyes: Conjunctivae are normal.  Neck: Normal range of motion.  Cardiovascular: Normal rate, regular rhythm and normal heart sounds.   Pulmonary/Chest: Effort normal.  Lymphadenopathy:       Head (right side): No submental, no submandibular, no tonsillar, no preauricular, no posterior auricular and no occipital adenopathy present.       Head (left side): No submental, no submandibular, no tonsillar, no preauricular, no posterior auricular and no occipital adenopathy present.    She has cervical adenopathy.       Right cervical: Posterior cervical adenopathy present. No superficial cervical and no deep cervical adenopathy present.      Left cervical: Posterior cervical adenopathy present. No superficial cervical and no deep cervical adenopathy present.       Right: No supraclavicular adenopathy present.       Left: No supraclavicular adenopathy present.  Neurological: She is alert and oriented to person, place, and time.  Skin: Skin is warm and dry.  Psychiatric: She has a normal mood and affect.  Vitals reviewed.   Results for orders placed or performed in visit on 03/22/16 (from the past 24 hour(s))  POCT rapid strep A     Status: None   Collection Time: 03/22/16  2:26 PM  Result Value Ref Range   Rapid Strep A Screen Negative Negative  POCT CBC     Status: Abnormal   Collection Time: 03/22/16  3:21 PM  Result Value Ref Range   WBC 5.5 4.6 - 10.2 K/uL   Lymph, poc 1.9 0.6 - 3.4   POC LYMPH PERCENT 34.1 10 - 50 %L   MID (cbc)  0.4 0 - 0.9   POC MID % 8.1 0 - 12 %M   POC Granulocyte 3.2 2 - 6.9   Granulocyte percent 57.8 37 - 80 %G   RBC 4.27 4.04 - 5.48 M/uL   Hemoglobin 13.4 12.2 - 16.2 g/dL   HCT, POC 14.738.0 82.937.7 - 47.9 %   MCV 88.9 80 - 97 fL   MCH, POC 31.4 (A) 27 - 31.2 pg   MCHC 35.4 31.8 - 35.4 g/dL   RDW, POC 56.212.6 %   Platelet Count, POC 193 142 - 424 K/uL   MPV 8.3 0 -  99.8 fL    Assessment and Plan :  1. Sore throat -Await lab results, will treat symptomatically in the meantime. Instructed to use tylenol for pain. Instructed to seek care immediately if any symptoms worsen, or she develops new fever or chills.  - POCT rapid strep A - Culture, Group A Strep - POCT CBC - magic mouthwash w/lidocaine SOLN; Take 10 mLs by mouth every 2 (two) hours as needed for mouth pain.  Dispense: 360 mL; Refill: 0  2. Other fatigue - Labs pending  - Epstein-Barr virus VCA antibody panel  Benjiman CoreBrittany Erland Vivas, PA-C  [facility] 03/22/2016 3:30 PM

## 2016-03-23 LAB — EPSTEIN-BARR VIRUS VCA ANTIBODY PANEL
EBV NA IgG: 94.6 U/mL — ABNORMAL HIGH (ref 0.0–17.9)
EBV VCA IgG: 145 U/mL — ABNORMAL HIGH (ref 0.0–17.9)
EBV VCA IgM: <36 U/mL (ref 0.0–35.9)

## 2016-03-25 LAB — CULTURE, GROUP A STREP: STREP A CULTURE: NEGATIVE

## 2016-03-26 ENCOUNTER — Telehealth: Payer: Self-pay

## 2016-03-26 ENCOUNTER — Ambulatory Visit (INDEPENDENT_AMBULATORY_CARE_PROVIDER_SITE_OTHER): Payer: BC Managed Care – PPO | Admitting: Physician Assistant

## 2016-03-26 VITALS — BP 98/60 | HR 72 | Temp 97.3°F | Ht 62.0 in | Wt 119.4 lb

## 2016-03-26 DIAGNOSIS — R51 Headache: Secondary | ICD-10-CM

## 2016-03-26 DIAGNOSIS — R69 Illness, unspecified: Secondary | ICD-10-CM

## 2016-03-26 DIAGNOSIS — R52 Pain, unspecified: Secondary | ICD-10-CM | POA: Diagnosis not present

## 2016-03-26 DIAGNOSIS — J111 Influenza due to unidentified influenza virus with other respiratory manifestations: Secondary | ICD-10-CM

## 2016-03-26 DIAGNOSIS — R519 Headache, unspecified: Secondary | ICD-10-CM

## 2016-03-26 NOTE — Telephone Encounter (Signed)
Notified patient that results were released in My Chart.

## 2016-03-26 NOTE — Progress Notes (Signed)
Sarah Mullins  MRN: [id number] DOB: December 01, 1985  PCP: Magdalene River, PA-C  Subjective:  Pt is a 31 year old female who presents to clinic for f/u sore throat and fatigue.  She was here 2/8 for the same. WBC wnl, negative EBV, negative strep culture. Treated supportively.  Today c/o body ache and fatigue. Her symptoms started 5 days ago. "I cannot get enough sleep". Sleeping well. Sore throat has improved since symptom onset.  She is drinking plenty of fluids. +neck is sore, headache. Endorses decreased appetite.  She had the flu shot this year. 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 She is nervous because she does not want to get her children at home sick.  Tylenol is helping headache.   Denies fever, chills, neck stiffness, reduced neck ROM, sinus pain, sinus pressure.   Review of Systems  Constitutional: Positive for appetite change and fatigue. Negative for chills, diaphoresis and fever.  HENT: Negative for congestion, postnasal drip, rhinorrhea, sinus pressure, sneezing and sore throat.   Respiratory: Negative for cough, chest tightness, shortness of breath and wheezing.   Cardiovascular: Negative for chest pain and palpitations.  Gastrointestinal: Negative for abdominal pain, diarrhea, nausea and vomiting.  Musculoskeletal: Positive for myalgias. Negative for neck stiffness.  Neurological: Positive for headaches. Negative for weakness and light-headedness.    Patient Active Problem List   Diagnosis Date Noted  . Personal history of previous postdates pregnancy 02/01/2015  . Postpartum care following vaginal delivery (12/20) 02/01/2015  . NAUSEA WITH VOMITING 06/17/2008  . CONSTIPATION, CHRONIC 03/12/2008  . RAYNAUD'S SYNDROME 08/18/2007  . KNEE PAIN, RIGHT 08/18/2007  . CONSTIPATION, HX OF 08/18/2007    Current Outpatient Prescriptions on File Prior to Visit  Medication Sig Dispense Refill  . magic mouthwash w/lidocaine SOLN  Take 10 mLs by mouth every 2 (two) hours as needed for mouth pain. 360 mL 0  . [DISCONTINUED] Norgestimate-Ethinyl Estradiol Triphasic (ORTHO TRI-CYCLEN LO) 0.18/0.215/0.25 MG-25 MCG tablet Take 1 tablet by mouth daily.       No current facility-administered medications on file prior to visit.     Allergies  Allergen Reactions  . Beta Adrenergic Blockers     REACTION: circulation problems     Objective:  BP 98/60 (BP Location: Right Arm, Patient Position: Sitting, Cuff Size: Small)   Pulse 72   Temp 97.3 F (36.3 C) (Oral)   Ht 5\' 2"  (1.575 m)   Wt 119 lb 6.4 oz (54.2 kg)   LMP 03/21/2016 (Exact Date)   SpO2 100%   Breastfeeding? No   BMI 21.84 kg/m   Physical Exam  Constitutional: She is oriented to person, place, and time and well-developed, well-nourished, and in no distress. No distress.  HENT:  Right Ear: Tympanic membrane normal.  Left Ear: Tympanic membrane normal.  Mouth/Throat: Oropharynx is clear and moist and mucous membranes are normal.  Cardiovascular: Normal rate, regular rhythm, S1 normal, S2 normal and normal heart sounds.   Pulmonary/Chest: Effort normal and breath sounds normal.  Lymphadenopathy:       Head (right side): Posterior auricular adenopathy present.       Head (left side): Posterior auricular adenopathy present.  Neurological: She is alert and oriented to person, place, and time. GCS score is 15.  Skin: Skin is warm and dry.  Psychiatric: Mood, memory, affect and judgment normal.  Vitals reviewed.   Assessment and Plan :  1. Influenza-like illness 2. Body aches 3. Intractable headache,  unspecified chronicity pattern, unspecified headache type - Pt is here for continuation of symptoms. Sore throat improved, however c/o headache and fatigue. She was here four days ago, negative strep and EBV, CBC wnl. Suspect flu. Will not treat as she is >48hrs within symptom onset. Supportive care encouraged: Push fluids, rest, ibuprofen/Tylenol for pain. RTC  in 5-7 days if no improvement.   Marco CollieWhitney Alasha Mcguinness, PA-C  [facility] 03/26/2016 4:40 PM

## 2016-03-26 NOTE — Telephone Encounter (Signed)
Pt is looking for lab results   Best number [phone]

## 2016-03-26 NOTE — Patient Instructions (Addendum)
Warm tea with honey, lemon, cloves and slices of ginger. Get plenty of rest.   Thank you for coming in today. I hope you feel we met your needs.  Feel free to call [facility] if you have any questions or further requests.  Please consider signing up for MyChart if you do not already have it, as this is a great way to communicate with me.  Best,  [employer], PA-C

## 2016-04-20 DIAGNOSIS — N96 Recurrent pregnancy loss: Secondary | ICD-10-CM | POA: Diagnosis not present

## 2016-04-24 DIAGNOSIS — N96 Recurrent pregnancy loss: Secondary | ICD-10-CM | POA: Diagnosis not present

## 2016-05-02 DIAGNOSIS — N96 Recurrent pregnancy loss: Secondary | ICD-10-CM | POA: Diagnosis not present

## 2016-05-22 DIAGNOSIS — N96 Recurrent pregnancy loss: Secondary | ICD-10-CM | POA: Diagnosis not present

## 2016-05-22 DIAGNOSIS — E7212 Methylenetetrahydrofolate reductase deficiency: Secondary | ICD-10-CM | POA: Diagnosis not present

## 2016-05-22 DIAGNOSIS — Z2089 Contact with and (suspected) exposure to other communicable diseases: Secondary | ICD-10-CM | POA: Diagnosis not present

## 2016-05-22 DIAGNOSIS — W57XXXA Bitten or stung by nonvenomous insect and other nonvenomous arthropods, initial encounter: Secondary | ICD-10-CM | POA: Diagnosis not present

## 2016-06-06 DIAGNOSIS — Z3200 Encounter for pregnancy test, result unknown: Secondary | ICD-10-CM | POA: Diagnosis not present

## 2016-06-06 DIAGNOSIS — Z8759 Personal history of other complications of pregnancy, childbirth and the puerperium: Secondary | ICD-10-CM | POA: Diagnosis not present

## 2016-06-20 DIAGNOSIS — Z3201 Encounter for pregnancy test, result positive: Secondary | ICD-10-CM | POA: Diagnosis not present

## 2016-06-28 DIAGNOSIS — Z3481 Encounter for supervision of other normal pregnancy, first trimester: Secondary | ICD-10-CM | POA: Diagnosis not present

## 2016-06-28 DIAGNOSIS — Z3689 Encounter for other specified antenatal screening: Secondary | ICD-10-CM | POA: Diagnosis not present

## 2016-06-28 LAB — OB RESULTS CONSOLE RPR: RPR: NONREACTIVE

## 2016-06-28 LAB — OB RESULTS CONSOLE ABO/RH: RH Type: POSITIVE

## 2016-06-28 LAB — OB RESULTS CONSOLE HEPATITIS B SURFACE ANTIGEN: Hepatitis B Surface Ag: NEGATIVE

## 2016-06-28 LAB — OB RESULTS CONSOLE GC/CHLAMYDIA
CHLAMYDIA, DNA PROBE: NEGATIVE
GC PROBE AMP, GENITAL: NEGATIVE

## 2016-06-28 LAB — OB RESULTS CONSOLE HIV ANTIBODY (ROUTINE TESTING): HIV: NONREACTIVE

## 2016-06-28 LAB — OB RESULTS CONSOLE ANTIBODY SCREEN: ANTIBODY SCREEN: NEGATIVE

## 2016-06-28 LAB — OB RESULTS CONSOLE RUBELLA ANTIBODY, IGM: Rubella: IMMUNE

## 2016-07-06 DIAGNOSIS — Z3A10 10 weeks gestation of pregnancy: Secondary | ICD-10-CM | POA: Diagnosis not present

## 2016-07-06 DIAGNOSIS — O09291 Supervision of pregnancy with other poor reproductive or obstetric history, first trimester: Secondary | ICD-10-CM | POA: Diagnosis not present

## 2016-07-12 DIAGNOSIS — O09291 Supervision of pregnancy with other poor reproductive or obstetric history, first trimester: Secondary | ICD-10-CM | POA: Diagnosis not present

## 2016-07-12 DIAGNOSIS — Z3A10 10 weeks gestation of pregnancy: Secondary | ICD-10-CM | POA: Diagnosis not present

## 2016-07-12 DIAGNOSIS — Z3689 Encounter for other specified antenatal screening: Secondary | ICD-10-CM | POA: Diagnosis not present

## 2016-08-20 DIAGNOSIS — O09292 Supervision of pregnancy with other poor reproductive or obstetric history, second trimester: Secondary | ICD-10-CM | POA: Diagnosis not present

## 2016-08-20 DIAGNOSIS — Z3A16 16 weeks gestation of pregnancy: Secondary | ICD-10-CM | POA: Diagnosis not present

## 2016-09-11 DIAGNOSIS — O09291 Supervision of pregnancy with other poor reproductive or obstetric history, first trimester: Secondary | ICD-10-CM | POA: Diagnosis not present

## 2016-09-11 DIAGNOSIS — Z363 Encounter for antenatal screening for malformations: Secondary | ICD-10-CM | POA: Diagnosis not present

## 2016-09-11 DIAGNOSIS — O358XX0 Maternal care for other (suspected) fetal abnormality and damage, not applicable or unspecified: Secondary | ICD-10-CM | POA: Diagnosis not present

## 2016-09-11 DIAGNOSIS — Z3A19 19 weeks gestation of pregnancy: Secondary | ICD-10-CM | POA: Diagnosis not present

## 2016-09-11 DIAGNOSIS — O283 Abnormal ultrasonic finding on antenatal screening of mother: Secondary | ICD-10-CM | POA: Diagnosis not present

## 2016-09-20 DIAGNOSIS — O09292 Supervision of pregnancy with other poor reproductive or obstetric history, second trimester: Secondary | ICD-10-CM | POA: Diagnosis not present

## 2016-09-20 DIAGNOSIS — Z3A20 20 weeks gestation of pregnancy: Secondary | ICD-10-CM | POA: Diagnosis not present

## 2016-09-27 ENCOUNTER — Other Ambulatory Visit (HOSPITAL_COMMUNITY): Payer: Self-pay | Admitting: Obstetrics and Gynecology

## 2016-09-27 DIAGNOSIS — O283 Abnormal ultrasonic finding on antenatal screening of mother: Secondary | ICD-10-CM

## 2016-09-27 DIAGNOSIS — Z3689 Encounter for other specified antenatal screening: Secondary | ICD-10-CM

## 2016-09-27 DIAGNOSIS — Z3A23 23 weeks gestation of pregnancy: Secondary | ICD-10-CM

## 2016-10-08 ENCOUNTER — Ambulatory Visit (HOSPITAL_COMMUNITY): Payer: BC Managed Care – PPO

## 2016-10-09 ENCOUNTER — Ambulatory Visit (HOSPITAL_COMMUNITY): Payer: BC Managed Care – PPO

## 2016-10-16 ENCOUNTER — Encounter (HOSPITAL_COMMUNITY): Payer: Self-pay | Admitting: *Deleted

## 2016-10-17 ENCOUNTER — Encounter (HOSPITAL_COMMUNITY): Payer: Self-pay

## 2016-10-17 ENCOUNTER — Ambulatory Visit (HOSPITAL_COMMUNITY)
Admission: RE | Admit: 2016-10-17 | Discharge: 2016-10-17 | Disposition: A | Discharge status: Home / Self Care | Payer: BLUE CROSS/BLUE SHIELD | Source: Ambulatory Visit | Attending: Obstetrics and Gynecology | Admitting: Obstetrics and Gynecology

## 2016-10-17 DIAGNOSIS — Z3689 Encounter for other specified antenatal screening: Secondary | ICD-10-CM

## 2016-10-17 DIAGNOSIS — Z363 Encounter for antenatal screening for malformations: Secondary | ICD-10-CM | POA: Insufficient documentation

## 2016-10-17 DIAGNOSIS — O359XX0 Maternal care for (suspected) fetal abnormality and damage, unspecified, not applicable or unspecified: Secondary | ICD-10-CM | POA: Diagnosis not present

## 2016-10-17 DIAGNOSIS — Z3A23 23 weeks gestation of pregnancy: Secondary | ICD-10-CM

## 2016-10-17 DIAGNOSIS — O283 Abnormal ultrasonic finding on antenatal screening of mother: Secondary | ICD-10-CM | POA: Insufficient documentation

## 2016-10-17 DIAGNOSIS — Z3A24 24 weeks gestation of pregnancy: Secondary | ICD-10-CM | POA: Diagnosis not present

## 2016-10-18 ENCOUNTER — Encounter (HOSPITAL_COMMUNITY): Payer: Self-pay

## 2016-10-18 ENCOUNTER — Other Ambulatory Visit (HOSPITAL_COMMUNITY): Payer: Self-pay

## 2016-10-18 DIAGNOSIS — Z3A24 24 weeks gestation of pregnancy: Secondary | ICD-10-CM | POA: Diagnosis not present

## 2016-10-18 DIAGNOSIS — O09292 Supervision of pregnancy with other poor reproductive or obstetric history, second trimester: Secondary | ICD-10-CM | POA: Diagnosis not present

## 2016-11-09 DIAGNOSIS — Z3A28 28 weeks gestation of pregnancy: Secondary | ICD-10-CM | POA: Diagnosis not present

## 2016-11-09 DIAGNOSIS — O09293 Supervision of pregnancy with other poor reproductive or obstetric history, third trimester: Secondary | ICD-10-CM | POA: Diagnosis not present

## 2016-11-09 DIAGNOSIS — Z3689 Encounter for other specified antenatal screening: Secondary | ICD-10-CM | POA: Diagnosis not present

## 2016-11-20 DIAGNOSIS — Z23 Encounter for immunization: Secondary | ICD-10-CM | POA: Diagnosis not present

## 2016-11-20 DIAGNOSIS — Z3A29 29 weeks gestation of pregnancy: Secondary | ICD-10-CM | POA: Diagnosis not present

## 2016-11-20 DIAGNOSIS — O09293 Supervision of pregnancy with other poor reproductive or obstetric history, third trimester: Secondary | ICD-10-CM | POA: Diagnosis not present

## 2016-12-03 DIAGNOSIS — O09293 Supervision of pregnancy with other poor reproductive or obstetric history, third trimester: Secondary | ICD-10-CM | POA: Diagnosis not present

## 2016-12-03 DIAGNOSIS — Z3A31 31 weeks gestation of pregnancy: Secondary | ICD-10-CM | POA: Diagnosis not present

## 2016-12-17 DIAGNOSIS — Z3A33 33 weeks gestation of pregnancy: Secondary | ICD-10-CM | POA: Diagnosis not present

## 2016-12-17 DIAGNOSIS — O09293 Supervision of pregnancy with other poor reproductive or obstetric history, third trimester: Secondary | ICD-10-CM | POA: Diagnosis not present

## 2016-12-31 DIAGNOSIS — Z3A35 35 weeks gestation of pregnancy: Secondary | ICD-10-CM | POA: Diagnosis not present

## 2016-12-31 DIAGNOSIS — Z3685 Encounter for antenatal screening for Streptococcus B: Secondary | ICD-10-CM | POA: Diagnosis not present

## 2016-12-31 DIAGNOSIS — O09293 Supervision of pregnancy with other poor reproductive or obstetric history, third trimester: Secondary | ICD-10-CM | POA: Diagnosis not present

## 2016-12-31 LAB — OB RESULTS CONSOLE GBS: GBS: NEGATIVE

## 2017-01-08 DIAGNOSIS — O09293 Supervision of pregnancy with other poor reproductive or obstetric history, third trimester: Secondary | ICD-10-CM | POA: Diagnosis not present

## 2017-01-08 DIAGNOSIS — Z3A36 36 weeks gestation of pregnancy: Secondary | ICD-10-CM | POA: Diagnosis not present

## 2017-01-14 DIAGNOSIS — Z3A37 37 weeks gestation of pregnancy: Secondary | ICD-10-CM | POA: Diagnosis not present

## 2017-01-14 DIAGNOSIS — O09293 Supervision of pregnancy with other poor reproductive or obstetric history, third trimester: Secondary | ICD-10-CM | POA: Diagnosis not present

## 2017-01-15 ENCOUNTER — Other Ambulatory Visit: Payer: Self-pay | Admitting: Obstetrics and Gynecology

## 2017-01-17 ENCOUNTER — Telehealth (HOSPITAL_COMMUNITY): Payer: Self-pay | Admitting: *Deleted

## 2017-01-17 ENCOUNTER — Encounter (HOSPITAL_COMMUNITY): Payer: Self-pay | Admitting: *Deleted

## 2017-01-17 NOTE — Telephone Encounter (Signed)
Preadmission screen  

## 2017-01-18 ENCOUNTER — Telehealth (HOSPITAL_COMMUNITY): Payer: Self-pay | Admitting: *Deleted

## 2017-01-18 ENCOUNTER — Encounter (HOSPITAL_COMMUNITY): Payer: Self-pay | Admitting: *Deleted

## 2017-01-18 NOTE — Telephone Encounter (Signed)
Preadmission screen  

## 2017-01-22 DIAGNOSIS — R768 Other specified abnormal immunological findings in serum: Secondary | ICD-10-CM | POA: Diagnosis not present

## 2017-01-22 DIAGNOSIS — Z3A38 38 weeks gestation of pregnancy: Secondary | ICD-10-CM | POA: Diagnosis not present

## 2017-01-22 DIAGNOSIS — O09293 Supervision of pregnancy with other poor reproductive or obstetric history, third trimester: Secondary | ICD-10-CM | POA: Diagnosis not present

## 2017-01-28 DIAGNOSIS — O09293 Supervision of pregnancy with other poor reproductive or obstetric history, third trimester: Secondary | ICD-10-CM | POA: Diagnosis not present

## 2017-01-28 DIAGNOSIS — Z3A39 39 weeks gestation of pregnancy: Secondary | ICD-10-CM | POA: Diagnosis not present

## 2017-01-30 ENCOUNTER — Inpatient Hospital Stay (HOSPITAL_COMMUNITY): Payer: BLUE CROSS/BLUE SHIELD | Admitting: Anesthesiology

## 2017-01-30 ENCOUNTER — Encounter (HOSPITAL_COMMUNITY): Payer: Self-pay

## 2017-01-30 ENCOUNTER — Inpatient Hospital Stay (HOSPITAL_COMMUNITY)
Admission: RE | Admit: 2017-01-30 | Discharge: 2017-01-31 | DRG: 806 | Disposition: A | Discharge status: Home / Self Care | Payer: BLUE CROSS/BLUE SHIELD | Source: Ambulatory Visit | Attending: Obstetrics and Gynecology | Admitting: Obstetrics and Gynecology

## 2017-01-30 DIAGNOSIS — R94120 Abnormal auditory function study: Secondary | ICD-10-CM | POA: Diagnosis not present

## 2017-01-30 DIAGNOSIS — Z3A39 39 weeks gestation of pregnancy: Secondary | ICD-10-CM

## 2017-01-30 DIAGNOSIS — O99284 Endocrine, nutritional and metabolic diseases complicating childbirth: Principal | ICD-10-CM | POA: Diagnosis present

## 2017-01-30 DIAGNOSIS — Z3A40 40 weeks gestation of pregnancy: Secondary | ICD-10-CM | POA: Diagnosis not present

## 2017-01-30 DIAGNOSIS — Z3490 Encounter for supervision of normal pregnancy, unspecified, unspecified trimester: Secondary | ICD-10-CM | POA: Diagnosis present

## 2017-01-30 DIAGNOSIS — Z23 Encounter for immunization: Secondary | ICD-10-CM | POA: Diagnosis not present

## 2017-01-30 DIAGNOSIS — E7212 Methylenetetrahydrofolate reductase deficiency: Secondary | ICD-10-CM | POA: Diagnosis not present

## 2017-01-30 LAB — CBC
HEMATOCRIT: 37.8 % (ref 36.0–46.0)
HEMOGLOBIN: 12.1 g/dL (ref 12.0–15.0)
MCH: 29.8 pg (ref 26.0–34.0)
MCHC: 32 g/dL (ref 30.0–36.0)
MCV: 93.1 fL (ref 78.0–100.0)
Platelets: 298 10*3/uL (ref 150–400)
RBC: 4.06 MIL/uL (ref 3.87–5.11)
RDW: 13.2 % (ref 11.5–15.5)
WBC: 11.1 10*3/uL — ABNORMAL HIGH (ref 4.0–10.5)

## 2017-01-30 LAB — RPR: RPR Ser Ql: NONREACTIVE

## 2017-01-30 LAB — TYPE AND SCREEN
ABO/RH(D): O POS
Antibody Screen: NEGATIVE

## 2017-01-30 MED ORDER — FENTANYL 2.5 MCG/ML BUPIVACAINE 1/10 % EPIDURAL INFUSION (WH - ANES)
14.0000 mL/h | INTRAMUSCULAR | Status: DC | PRN
  Administered 2017-01-30 (×2): 14 mL/h via EPIDURAL
  Filled 2017-01-30: qty 100

## 2017-01-30 MED ORDER — METHYLERGONOVINE MALEATE 0.2 MG/ML IJ SOLN: 0.2000 mg | INTRAMUSCULAR | Status: DC | PRN

## 2017-01-30 MED ORDER — PHENYLEPHRINE 40 MCG/ML (10ML) SYRINGE FOR IV PUSH (FOR BLOOD PRESSURE SUPPORT)
80.0000 ug | PREFILLED_SYRINGE | INTRAVENOUS | Status: DC | PRN
  Administered 2017-01-30: 80 ug via INTRAVENOUS
  Filled 2017-01-30: qty 5

## 2017-01-30 MED ORDER — SOD CITRATE-CITRIC ACID 500-334 MG/5ML PO SOLN: 30.0000 mL | ORAL | Status: DC | PRN

## 2017-01-30 MED ORDER — EPHEDRINE 5 MG/ML INJ
10.0000 mg | INTRAVENOUS | Status: DC | PRN
  Administered 2017-01-30: 10 mg via INTRAVENOUS
  Filled 2017-01-30: qty 2

## 2017-01-30 MED ORDER — DIPHENHYDRAMINE HCL 50 MG/ML IJ SOLN: 12.5000 mg | INTRAMUSCULAR | Status: DC | PRN

## 2017-01-30 MED ORDER — SIMETHICONE 80 MG PO CHEW: 80.0000 mg | CHEWABLE_TABLET | ORAL | Status: DC | PRN

## 2017-01-30 MED ORDER — OXYCODONE-ACETAMINOPHEN 5-325 MG PO TABS: 2.0000 | ORAL_TABLET | ORAL | Status: DC | PRN

## 2017-01-30 MED ORDER — TETANUS-DIPHTH-ACELL PERTUSSIS 5-2.5-18.5 LF-MCG/0.5 IM SUSP: 0.5000 mL | Freq: Once | INTRAMUSCULAR | Status: DC

## 2017-01-30 MED ORDER — LIDOCAINE HCL (PF) 1 % IJ SOLN
INTRAMUSCULAR | Status: DC | PRN
  Administered 2017-01-30 (×2): 5 mL

## 2017-01-30 MED ORDER — TERBUTALINE SULFATE 1 MG/ML IJ SOLN
0.2500 mg | Freq: Once | INTRAMUSCULAR | Status: DC | PRN
  Filled 2017-01-30: qty 1

## 2017-01-30 MED ORDER — OXYTOCIN 40 UNITS IN LACTATED RINGERS INFUSION - SIMPLE MED
1.0000 m[IU]/min | INTRAVENOUS | Status: DC
  Administered 2017-01-30: 2 m[IU]/min via INTRAVENOUS
  Filled 2017-01-30: qty 1000

## 2017-01-30 MED ORDER — LACTATED RINGERS IV SOLN
500.0000 mL | INTRAVENOUS | Status: DC | PRN
  Administered 2017-01-30: 500 mL via INTRAVENOUS

## 2017-01-30 MED ORDER — BENZOCAINE-MENTHOL 20-0.5 % EX AERO: 1.0000 "application " | INHALATION_SPRAY | CUTANEOUS | Status: DC | PRN

## 2017-01-30 MED ORDER — WITCH HAZEL-GLYCERIN EX PADS: 1.0000 "application " | MEDICATED_PAD | CUTANEOUS | Status: DC | PRN

## 2017-01-30 MED ORDER — ONDANSETRON HCL 4 MG PO TABS: 4.0000 mg | ORAL_TABLET | ORAL | Status: DC | PRN

## 2017-01-30 MED ORDER — LACTATED RINGERS IV SOLN: 500.0000 mL | Freq: Once | INTRAVENOUS | Status: DC

## 2017-01-30 MED ORDER — ACETAMINOPHEN 325 MG PO TABS
650.0000 mg | ORAL_TABLET | ORAL | Status: DC | PRN
  Administered 2017-01-30 – 2017-01-31 (×2): 650 mg via ORAL
  Filled 2017-01-30 (×3): qty 2

## 2017-01-30 MED ORDER — ACETAMINOPHEN 325 MG PO TABS: 650.0000 mg | ORAL_TABLET | ORAL | Status: DC | PRN

## 2017-01-30 MED ORDER — OXYTOCIN 40 UNITS IN LACTATED RINGERS INFUSION - SIMPLE MED: 2.5000 [IU]/h | INTRAVENOUS | Status: DC

## 2017-01-30 MED ORDER — ONDANSETRON HCL 4 MG/2ML IJ SOLN: 4.0000 mg | Freq: Four times a day (QID) | INTRAMUSCULAR | Status: DC | PRN

## 2017-01-30 MED ORDER — DIPHENHYDRAMINE HCL 25 MG PO CAPS: 25.0000 mg | ORAL_CAPSULE | Freq: Four times a day (QID) | ORAL | Status: DC | PRN

## 2017-01-30 MED ORDER — OXYTOCIN BOLUS FROM INFUSION
500.0000 mL | Freq: Once | INTRAVENOUS | Status: AC
  Administered 2017-01-30: 500 mL via INTRAVENOUS

## 2017-01-30 MED ORDER — COCONUT OIL OIL: 1.0000 "application " | TOPICAL_OIL | Status: DC | PRN

## 2017-01-30 MED ORDER — ONDANSETRON HCL 4 MG/2ML IJ SOLN: 4.0000 mg | INTRAMUSCULAR | Status: DC | PRN

## 2017-01-30 MED ORDER — IBUPROFEN 600 MG PO TABS
600.0000 mg | ORAL_TABLET | Freq: Four times a day (QID) | ORAL | Status: DC
  Administered 2017-01-30 – 2017-01-31 (×4): 600 mg via ORAL
  Filled 2017-01-30 (×4): qty 1

## 2017-01-30 MED ORDER — PHENYLEPHRINE 40 MCG/ML (10ML) SYRINGE FOR IV PUSH (FOR BLOOD PRESSURE SUPPORT)
80.0000 ug | PREFILLED_SYRINGE | INTRAVENOUS | Status: DC | PRN
  Filled 2017-01-30: qty 5
  Filled 2017-01-30: qty 10

## 2017-01-30 MED ORDER — LIDOCAINE HCL (PF) 1 % IJ SOLN
30.0000 mL | INTRAMUSCULAR | Status: DC | PRN
  Filled 2017-01-30: qty 30

## 2017-01-30 MED ORDER — EPHEDRINE 5 MG/ML INJ
10.0000 mg | INTRAVENOUS | Status: DC | PRN
  Filled 2017-01-30: qty 2
  Filled 2017-01-30: qty 4

## 2017-01-30 MED ORDER — ZOLPIDEM TARTRATE 5 MG PO TABS: 5.0000 mg | ORAL_TABLET | Freq: Every evening | ORAL | Status: DC | PRN

## 2017-01-30 MED ORDER — OXYCODONE-ACETAMINOPHEN 5-325 MG PO TABS
1.0000 | ORAL_TABLET | ORAL | Status: DC | PRN
Start: 2017-01-30 — End: 2017-01-31

## 2017-01-30 MED ORDER — SENNOSIDES-DOCUSATE SODIUM 8.6-50 MG PO TABS
2.0000 | ORAL_TABLET | ORAL | Status: DC
  Administered 2017-01-30: 2 via ORAL
  Filled 2017-01-30: qty 2

## 2017-01-30 MED ORDER — LACTATED RINGERS IV SOLN
INTRAVENOUS | Status: DC
  Administered 2017-01-30 (×2): via INTRAVENOUS

## 2017-01-30 MED ORDER — METHYLERGONOVINE MALEATE 0.2 MG PO TABS: 0.2000 mg | ORAL_TABLET | ORAL | Status: DC | PRN

## 2017-01-30 MED ORDER — DIBUCAINE 1 % RE OINT: 1.0000 "application " | TOPICAL_OINTMENT | RECTAL | Status: DC | PRN

## 2017-01-30 MED ORDER — PRENATAL MULTIVITAMIN CH
1.0000 | ORAL_TABLET | Freq: Every day | ORAL | Status: DC
  Administered 2017-01-31: 1 via ORAL
  Filled 2017-01-30: qty 1

## 2017-01-30 NOTE — Anesthesia Pain Management Evaluation Note (Signed)
  [facility] Pain Management Visit Note  Patient: Sarah Mullins, 31 y.o., female  "Hello I am a [facility]. We have an anesthesia team available at all times to provide care throughout the hospital, including epidural management and anesthesia for C-section. I don't know your plan for the delivery whether it a natural birth, water birth, IV sedation, nitrous supplementation, doula or epidural, but we want to meet your pain goals."   1.Was your pain managed to your expectations on prior hospitalizations?   Yes   2.What is your expectation for pain management during this hospitalization?     Epidural  3.How can we help you reach that goal? 2  Record the patient's initial score and the patient's pain goal.   Pain: 2  Pain Goal: 4 [facility] wants you to be able to say your pain was always managed very well.  Sarah Mullins,Sarah Mullins 01/30/2017

## 2017-01-30 NOTE — H&P (Signed)
Sarah Mullins is a 31 y.o. female presenting for IOL for pos anticardiolipin ab and poor ob hx.. OB History    Gravida Para Term Preterm AB Living   7 3 3   3 3    SAB TAB Ectopic Multiple Live Births   3     0 3     Past Medical History:  Diagnosis Date  . H/O varicella   . Instability of joint    left knee  . Medical history non-contributory   . Raynaud's phenomenon    mild   Past Surgical History:  Procedure Laterality Date  . APPENDECTOMY    . wisdom     Family History: family history includes Cancer in her maternal grandmother and mother; Dementia in her paternal grandfather; Heart disease in her maternal grandfather. Social History:  reports that  has never smoked. she has never used smokeless tobacco. She reports that she does not drink alcohol or use drugs.     Maternal Diabetes: No Genetic Screening: Normal Maternal Ultrasounds/Referrals: Normal Fetal Ultrasounds or other Referrals:  None Maternal Substance Abuse:  No Significant Maternal Medications:  None Significant Maternal Lab Results:  None Other Comments:  None  Review of Systems  Constitutional: Negative.   All other systems reviewed and are negative.  Maternal Medical History:  Contractions: Onset was less than 1 hour ago.   Perceived severity is mild.    Fetal activity: Perceived fetal activity is normal.   Last perceived fetal movement was within the past hour.    Prenatal complications: no prenatal complications Prenatal Complications - Diabetes: none.    Dilation: 4 Effacement (%): 60 Station: Ballotable Exam by:: Henderson NewcomerStephanie Faulk, RN Blood pressure 102/67, pulse 93, temperature 98.1 F (36.7 C), temperature source Oral, resp. rate 18, height 5\' 2"  (1.575 m), weight 68.9 kg (152 lb), last menstrual period 03/21/2016, not currently breastfeeding. Maternal Exam:  Uterine Assessment: Contraction strength is mild.  Contraction frequency is irregular.   Abdomen: Patient reports no  abdominal tenderness. Fetal presentation: vertex  Introitus: Normal vagina.  Ferning test: not done.  Nitrazine test: not done. Amniotic fluid character: not assessed.  Pelvis: adequate for delivery.   Cervix: Cervix evaluated by digital exam.     Physical Exam  Nursing note and vitals reviewed. Constitutional: She is oriented to person, place, and time. She appears well-developed and well-nourished.  HENT:  Head: Normocephalic and atraumatic.  Neck: Normal range of motion. Neck supple.  Cardiovascular: Normal rate and regular rhythm.  Respiratory: Effort normal and breath sounds normal.  GI: Bowel sounds are normal.  Genitourinary: Vagina normal and uterus normal.  Musculoskeletal: Normal range of motion.  Neurological: She is alert and oriented to person, place, and time. She has normal reflexes.  Skin: Skin is warm and dry.  Psychiatric: She has a normal mood and affect.    Prenatal labs: ABO, Rh: O/Positive/-- (05/17 0000) Antibody: Negative (05/17 0000) Rubella: Immune (05/17 0000) RPR: Nonreactive (05/17 0000)  HBsAg: Negative (05/17 0000)  HIV: Non-reactive (05/17 0000)  GBS: Negative (11/19 0000)   Assessment/Plan: 39 week IUP Pos anticardiolipin ab with poor ob hx IOL   Sarah Mullins J 01/30/2017, 8:23 AM

## 2017-01-30 NOTE — Anesthesia Procedure Notes (Signed)
Epidural Patient location during procedure: OB  Staffing Anesthesiologist: Cheryl Stabenow, MD Performed: anesthesiologist   Preanesthetic Checklist Completed: patient identified, site marked, surgical consent, pre-op evaluation, timeout performed, IV checked, risks and benefits discussed and monitors and equipment checked  Epidural Patient position: sitting Prep: DuraPrep Patient monitoring: heart rate, continuous pulse ox and blood pressure Approach: right paramedian Location: L3-L4 Injection technique: LOR saline  Needle:  Needle type: Tuohy  Needle gauge: 17 G Needle length: 9 cm and 9 Needle insertion depth: 6 cm Catheter type: closed end flexible Catheter size: 20 Guage Catheter at skin depth: 10 cm Test dose: negative  Assessment Events: blood not aspirated, injection not painful, no injection resistance, negative IV test and no paresthesia  Additional Notes Patient identified. Risks/Benefits/Options discussed with patient including but not limited to bleeding, infection, nerve damage, paralysis, failed block, incomplete pain control, headache, blood pressure changes, nausea, vomiting, reactions to medication both or allergic, itching and postpartum back pain. Confirmed with bedside nurse the patient's most recent platelet count. Confirmed with patient that they are not currently taking any anticoagulation, have any bleeding history or any family history of bleeding disorders. Patient expressed understanding and wished to proceed. All questions were answered. Sterile technique was used throughout the entire procedure. Please see nursing notes for vital signs. Test dose was given through epidural needle and negative prior to continuing to dose epidural or start infusion. Warning signs of high block given to the patient including shortness of breath, tingling/numbness in hands, complete motor block, or any concerning symptoms with instructions to call for help. Patient was given  instructions on fall risk and not to get out of bed. All questions and concerns addressed with instructions to call with any issues.     

## 2017-01-30 NOTE — Anesthesia Preprocedure Evaluation (Signed)

## 2017-01-31 LAB — CBC
HCT: 31.7 % — ABNORMAL LOW (ref 36.0–46.0)
Hemoglobin: 10.4 g/dL — ABNORMAL LOW (ref 12.0–15.0)
MCH: 29.9 pg (ref 26.0–34.0)
MCHC: 32.8 g/dL (ref 30.0–36.0)
MCV: 91.1 fL (ref 78.0–100.0)
PLATELETS: 265 10*3/uL (ref 150–400)
RBC: 3.48 MIL/uL — ABNORMAL LOW (ref 3.87–5.11)
RDW: 13.4 % (ref 11.5–15.5)
WBC: 14.7 10*3/uL — ABNORMAL HIGH (ref 4.0–10.5)

## 2017-01-31 LAB — BIRTH TISSUE RECOVERY COLLECTION (PLACENTA DONATION)

## 2017-01-31 MED ORDER — IBUPROFEN 600 MG PO TABS: 600.0000 mg | ORAL_TABLET | Freq: Four times a day (QID) | ORAL | 0 refills | Status: DC

## 2017-01-31 NOTE — Discharge Summary (Signed)
Obstetric Discharge Summary Reason for Admission: induction of labor - ACA (+) / poor OB hx / heterozygous MTFHR Prenatal Procedures: cerclage, ultrasound and MFM consult Intrapartum Procedures: spontaneous vaginal delivery and epidural Postpartum Procedures: none Complications-Operative and Postpartum: none Hemoglobin  Date Value Ref Range Status  01/31/2017 10.4 (L) 12.0 - 15.0 g/dL Final   HCT  Date Value Ref Range Status  01/31/2017 31.7 (L) 36.0 - 46.0 % Final    Physical Exam:  General: alert, cooperative and no distress Lochia: appropriate Uterine Fundus: firm Perineum: intact DVT Evaluation: No evidence of DVT/ takes baby ASA prophylaxis  Discharge Diagnoses: Term Pregnancy-delivered  Discharge Information: Date: 01/31/2017 Activity: pelvic rest Diet: routine Medications: PNV, Ibuprofen and baby ASA Condition: stable Instructions: refer to practice specific booklet Discharge to: home Follow-up Information    Sarah Mullins, Richard, MD. Schedule an appointment as soon as possible for a visit in 6 week(s).   Specialty:  Obstetrics and Gynecology Contact information: [address] [place] [address] [phone]           Newborn Data: Live born female  Birth Weight: 8 lb 10.3 oz (3921 g) APGAR: 9, 9  Newborn Delivery   Birth date/time:  01/30/2017 14:48:00 Delivery type:  Vaginal, Spontaneous     Home with mother.  Marlinda MikeBAILEY, Yolani Vo 01/31/2017, 9:44 AM

## 2017-01-31 NOTE — Lactation Note (Signed)
This note was copied from a baby's chart. Lactation Consultation Note  Patient Name: Girl Gerome ApleySavannah Nephew ZHYQM'VToday's Date: 01/31/2017 Reason for consult: Initial assessment   P4, Ex BF 6-9 months.  Baby in nursery getting hearing screen. Mother states she knows how to hand express and has viewed drops. Mother denies questions or concerns and states baby is breastfeeding well. Mom encouraged to feed baby 8-12 times/24 hours and with feeding cues.  Mom made aware of O/P services, breastfeeding support groups, community resources, and our phone # for post-discharge questions.     Maternal Data    Feeding Feeding Type: Breast Fed Length of feed: 15 min  LATCH Score                   Interventions    Lactation Tools Discussed/Used     Consult Status Consult Status: Complete    Hardie PulleyBerkelhammer, Lateshia Schmoker Boschen 01/31/2017, 11:43 AM

## 2017-01-31 NOTE — Anesthesia Postprocedure Evaluation (Signed)
Anesthesia Post Note  Patient: Texas InstrumentsSavannah L Mullins  Procedure(s) Performed: AN AD HOC LABOR EPIDURAL     Patient location during evaluation: Mother Baby Anesthesia Type: Epidural Level of consciousness: awake and alert and oriented Pain management: satisfactory to patient Vital Signs Assessment: post-procedure vital signs reviewed and stable Respiratory status: spontaneous breathing and nonlabored ventilation Cardiovascular status: stable Postop Assessment: no headache, no backache, no signs of nausea or vomiting, adequate PO intake and patient able to bend at knees (patient up walking) Anesthetic complications: no    Last Vitals:  Vitals:   01/30/17 2123 01/31/17 0520  BP: (!) 105/58 103/62  Pulse: 78 67  Resp: 18 18  Temp: 36.6 C 36.5 C  SpO2:      Last Pain:  Vitals:   01/31/17 0520  TempSrc: Oral  PainSc: 2    Pain Goal: Patients Stated Pain Goal: 7 (01/30/17 0848)               Madison HickmanGREGORY,Jabree Rebert

## 2017-01-31 NOTE — Progress Notes (Signed)
PPD 1 SVD - no repair / intact  S:  Reports feeling well- wants to go home today             Tolerating po/ No nausea or vomiting             Bleeding is light             Pain controlled with motrin             Up ad lib / ambulatory / voiding QS  Newborn breast feeding  / female  O:   VS: BP 103/62 (BP Location: Right Arm)   Pulse 67   Temp 97.7 F (36.5 C) (Oral)   Resp 18   Ht 5\' 2"  (1.575 m)   Wt 68.9 kg (152 lb)   LMP 03/21/2016 (LMP Unknown)   SpO2 96%   Breastfeeding? Unknown   BMI 27.80 kg/m    LABS:             Recent Labs    01/30/17 0734 01/31/17 0507  WBC 11.1* 14.7*  HGB 12.1 10.4*  PLT 298 265               Blood type: --/--/O POS (12/19 0734)  Rubella: Immune (05/17 0000)                     I&O: Intake/Output      12/19 0701 - 12/20 0700 12/20 0701 - 12/21 0700   Urine (mL/kg/hr) 200 (0.1)    Blood 100    Total Output 300    Net -300                     Physical Exam:             Alert and oriented X3  Abdomen: soft, non-tender, non-distended              Fundus: firm, non-tender, U-1  Perineum: intact without edema  Lochia: light  Extremities: no edema, no calf pain or tenderness  A: PPD # 1 without repair   Doing well - stable status  P: Routine post partum orders  DC home - WOB booklet - instructions reviewed  Marlinda MikeBAILEY, Alizee Maple CNM, MSN, [facility]ACNM 01/31/2017, 7:42 AM

## 2017-02-01 ENCOUNTER — Inpatient Hospital Stay (HOSPITAL_COMMUNITY)
Admission: AD | Admit: 2017-02-01 | Payer: BC Managed Care – PPO | Source: Ambulatory Visit | Admitting: Obstetrics and Gynecology

## 2017-03-06 DIAGNOSIS — Z124 Encounter for screening for malignant neoplasm of cervix: Secondary | ICD-10-CM | POA: Diagnosis not present

## 2017-10-11 ENCOUNTER — Ambulatory Visit: Payer: BLUE CROSS/BLUE SHIELD | Admitting: Family Medicine

## 2017-10-18 ENCOUNTER — Ambulatory Visit (INDEPENDENT_AMBULATORY_CARE_PROVIDER_SITE_OTHER): Payer: BLUE CROSS/BLUE SHIELD | Admitting: Family Medicine

## 2017-10-18 ENCOUNTER — Encounter: Payer: Self-pay | Admitting: Family Medicine

## 2017-10-18 VITALS — BP 95/60 | HR 68 | Temp 98.3°F | Resp 20 | Ht 62.0 in | Wt 122.0 lb

## 2017-10-18 DIAGNOSIS — Z79899 Other long term (current) drug therapy: Secondary | ICD-10-CM | POA: Diagnosis not present

## 2017-10-18 DIAGNOSIS — N912 Amenorrhea, unspecified: Secondary | ICD-10-CM

## 2017-10-18 DIAGNOSIS — Z23 Encounter for immunization: Secondary | ICD-10-CM | POA: Diagnosis not present

## 2017-10-18 DIAGNOSIS — Z0000 Encounter for general adult medical examination without abnormal findings: Secondary | ICD-10-CM | POA: Diagnosis not present

## 2017-10-18 LAB — COMPREHENSIVE METABOLIC PANEL
ALBUMIN: 4.5 g/dL (ref 3.5–5.2)
ALK PHOS: 58 U/L (ref 39–117)
ALT: 12 U/L (ref 0–35)
AST: 13 U/L (ref 0–37)
BILIRUBIN TOTAL: 0.5 mg/dL (ref 0.2–1.2)
BUN: 13 mg/dL (ref 6–23)
CALCIUM: 9.3 mg/dL (ref 8.4–10.5)
CO2: 27 meq/L (ref 19–32)
Chloride: 105 mEq/L (ref 96–112)
Creatinine, Ser: 0.68 mg/dL (ref 0.40–1.20)
GFR: 105.46 mL/min (ref >60.00)
Glucose, Bld: 80 mg/dL (ref 70–99)
Potassium: 4.3 mEq/L (ref 3.5–5.1)
Sodium: 138 mEq/L (ref 135–145)
Total Protein: 6.8 g/dL (ref 6.0–8.3)

## 2017-10-18 LAB — TSH: TSH: 0.96 u[IU]/mL (ref 0.35–4.50)

## 2017-10-18 LAB — CBC WITH DIFFERENTIAL/PLATELET
Basophils Absolute: 0 10*3/uL (ref 0.0–0.1)
Basophils Relative: 0.7 % (ref 0.0–3.0)
EOS ABS: 0.1 10*3/uL (ref 0.0–0.7)
Eosinophils Relative: 2.1 % (ref 0.0–5.0)
HEMATOCRIT: 40.8 % (ref 36.0–46.0)
HEMOGLOBIN: 13.5 g/dL (ref 12.0–15.0)
LYMPHS PCT: 29.4 % (ref 12.0–46.0)
Lymphs Abs: 1.5 10*3/uL (ref 0.7–4.0)
MCHC: 33.1 g/dL (ref 30.0–36.0)
MCV: 92.6 fl (ref 78.0–100.0)
MONO ABS: 0.5 10*3/uL (ref 0.1–1.0)
Monocytes Relative: 9 % (ref 3.0–12.0)
Neutro Abs: 3 10*3/uL (ref 1.4–7.7)
Neutrophils Relative %: 58.8 % (ref 43.0–77.0)
Platelets: 219 10*3/uL (ref 150.0–400.0)
RBC: 4.4 Mil/uL (ref 3.87–5.11)
RDW: 12.8 % (ref 11.5–15.5)
WBC: 5.2 10*3/uL (ref 4.0–10.5)

## 2017-10-18 NOTE — Patient Instructions (Signed)
It was a pleasure to meet you today.  Follow up yearly for physicals. Sooner if needed or ill.   Dr. Raoul Pitch  Please help [place] help you:  We are honored you have chosen [facility] for your Primary Care home. Below you will find basic instructions that you may need to access in the future. Please help [place] help you by reading the instructions, which cover many of the frequent questions we experience.   Prescription refills and request:  -In order to allow more efficient response time, please call your pharmacy for all refills. They will forward the request electronically to [place]. This allows for the quickest possible response. Request left on a nurse line can take longer to refill, since these are checked as time allows between office patients and other phone calls.  - refill request can take up to 3-5 working days to complete.  - If request is sent electronically and request is appropiate, it is usually completed in 1-2 business days.  - all patients will need to be seen routinely for all chronic medical conditions requiring prescription medications (see follow-up below). If you are overdue for follow up on your condition, you will be asked to make an appointment and we will call in enough medication to cover you until your appointment (up to 30 days).  - all controlled substances will require a face to face visit to request/refill.  - if you desire your prescriptions to go through a new pharmacy, and have an active script at original pharmacy, you will need to call your pharmacy and have scripts transferred to new pharmacy. This is completed between the pharmacy locations and not by your provider.    Results: If any images or labs were ordered, it can take up to 1 week to get results depending on the test ordered and the lab/facility running and resulting the test. - Normal or stable results, which do not need further discussion, may be released to your mychart immediately with attached note to  you. A call may not be generated for normal results. Please make certain to sign up for mychart. If you have questions on how to activate your mychart you can call the front office.  - If your results need further discussion, our office will attempt to contact you via phone, and if unable to reach you after 2 attempts, we will release your abnormal result to your mychart with instructions.  - All results will be automatically released in mychart after 1 week.  - Your provider will provide you with explanation and instruction on all relevant material in your results. Please keep in mind, results and labs may appear confusing or abnormal to the untrained eye, but it does not mean they are actually abnormal for you personally. If you have any questions about your results that are not covered, or you desire more detailed explanation than what was provided, you should make an appointment with your provider to do so.   Our office handles many outgoing and incoming calls daily. If we have not contacted you within 1 week about your results, please check your mychart to see if there is a message first and if not, then contact our office.  In helping with this matter, you help decrease call volume, and therefore allow [place] to be able to respond to patients needs more efficiently.   Acute office visits (sick visit):  An acute visit is intended for a new problem and are scheduled in shorter time slots to  allow schedule openings for patients with new problems. This is the appropriate visit to discuss a new problem. Problems will not be addressed by phone call or Echart message. Appointment is needed if requesting treatment. In order to provide you with excellent quality medical care with proper time for you to explain your problem, have an exam and receive treatment with instructions, these appointments should be limited to one new problem per visit. If you experience a new problem, in which you desire to be addressed,  please make an acute office visit, we save openings on the schedule to accommodate you. Please do not save your new problem for any other type of visit, let us take care of it properly and quickly for you.   Follow up visits:  Depending on your condition(s) your provider will need to see you routinely in order to provide you with quality care and prescribe medication(s). Most chronic conditions (Example: hypertension, Diabetes, depression/anxiety... etc), require visits a couple times a year. Your provider will instruct you on proper follow up for your personal medical conditions and history. Please make certain to make follow up appointments for your condition as instructed. Failing to do so could result in lapse in your medication treatment/refills. If you request a refill, and are overdue to be seen on a condition, we will always provide you with a 30 day script (once) to allow you time to schedule.    Medicare wellness (well visit): - we have a wonderful Nurse Maudie Mercury), that will meet with you and provide you will yearly medicare wellness visits. These visits should occur yearly (can not be scheduled less than 1 calendar year apart) and cover preventive health, immunizations, advance directives and screenings you are entitled to yearly through your medicare benefits. Do not miss out on your entitled benefits, this is when medicare will pay for these benefits to be ordered for you.  These are strongly encouraged by your provider and is the appropriate type of visit to make certain you are up to date with all preventive health benefits. If you have not had your medicare wellness exam in the last 12 months, please make certain to schedule one by calling the office and schedule your medicare wellness with Maudie Mercury as soon as possible.   Yearly physical (well visit):  - Adults are recommended to be seen yearly for physicals. Check with your insurance and date of your last physical, most insurances require one  calendar year between physicals. Physicals include all preventive health topics, screenings, medical exam and labs that are appropriate for gender/age and history. You may have fasting labs needed at this visit. This is a well visit (not a sick visit), new problems should not be covered during this visit (see acute visit).  - Pediatric patients are seen more frequently when they are younger. Your provider will advise you on well child visit timing that is appropriate for your their age. - This is not a medicare wellness visit. Medicare wellness exams do not have an exam portion to the visit. Some medicare companies allow for a physical, some do not allow a yearly physical. If your medicare allows a yearly physical you can schedule the medicare wellness with our nurse Maudie Mercury and have your physical with your provider after, on the same day. Please check with insurance for your full benefits.   Late Policy/No Shows:  - all new patients should arrive 15-30 minutes earlier than appointment to allow [place] time  to  obtain all personal demographics,  insurance information and for you to complete office paperwork. - All established patients should arrive 10-15 minutes earlier than appointment time to update all information and be checked in .  - In our best efforts to run on time, if you are late for your appointment you will be asked to either reschedule or if able, we will work you back into the schedule. There will be a wait time to work you back in the schedule,  depending on availability.  - If you are unable to make it to your appointment as scheduled, please call 24 hours ahead of time to allow [place] to fill the time slot with someone else who needs to be seen. If you do not cancel your appointment ahead of time, you may be charged a no show fee.   Health Maintenance, Female Adopting a healthy lifestyle and getting preventive care can go a long way to promote health and wellness. Talk with your health care  provider about what schedule of regular examinations is right for you. This is a good chance for you to check in with your provider about disease prevention and staying healthy. In between checkups, there are plenty of things you can do on your own. Experts have done a lot of research about which lifestyle changes and preventive measures are most likely to keep you healthy. Ask your health care provider for more information. Weight and diet Eat a healthy diet  Be sure to include plenty of vegetables, fruits, low-fat dairy products, and lean protein.  Do not eat a lot of foods high in solid fats, added sugars, or salt.  Get regular exercise. This is one of the most important things you can do for your health. ? Most adults should exercise for at least 150 minutes each week. The exercise should increase your heart rate and make you sweat (moderate-intensity exercise). ? Most adults should also do strengthening exercises at least twice a week. This is in addition to the moderate-intensity exercise.  Maintain a healthy weight  Body mass index (BMI) is a measurement that can be used to identify possible weight problems. It estimates body fat based on height and weight. Your health care provider can help determine your BMI and help you achieve or maintain a healthy weight.  For females 42 years of age and older: ? A BMI below 18.5 is considered underweight. ? A BMI of 18.5 to 24.9 is normal. ? A BMI of 25 to 29.9 is considered overweight. ? A BMI of 30 and above is considered obese.  Watch levels of cholesterol and blood lipids  You should start having your blood tested for lipids and cholesterol at 34 years of age, then have this test every 5 years.  You may need to have your cholesterol levels checked more often if: ? Your lipid or cholesterol levels are high. ? You are older than 34 years of age. ? You are at high risk for heart disease.  Cancer screening Lung Cancer  Lung cancer  screening is recommended for adults 81-70 years old who are at high risk for lung cancer because of a history of smoking.  A yearly low-dose CT scan of the lungs is recommended for people who: ? Currently smoke. ? Have quit within the past 15 years. ? Have at least a 30-pack-year history of smoking. A pack year is smoking an average of one pack of cigarettes a day for 1 year.  Yearly screening should continue until it has been 15 years  since you quit.  Yearly screening should stop if you develop a health problem that would prevent you from having lung cancer treatment.  Breast Cancer  Practice breast self-awareness. This means understanding how your breasts normally appear and feel.  It also means doing regular breast self-exams. Let your health care provider know about any changes, no matter how small.  If you are in your 20s or 30s, you should have a clinical breast exam (CBE) by a health care provider every 1-3 years as part of a regular health exam.  If you are 70 or older, have a CBE every year. Also consider having a breast X-ray (mammogram) every year.  If you have a family history of breast cancer, talk to your health care provider about genetic screening.  If you are at high risk for breast cancer, talk to your health care provider about having an MRI and a mammogram every year.  Breast cancer gene (BRCA) assessment is recommended for women who have family members with BRCA-related cancers. BRCA-related cancers include: ? Breast. ? Ovarian. ? Tubal. ? Peritoneal cancers.  Results of the assessment will determine the need for genetic counseling and BRCA1 and BRCA2 testing.  Cervical Cancer Your health care provider may recommend that you be screened regularly for cancer of the pelvic organs (ovaries, uterus, and vagina). This screening involves a pelvic examination, including checking for microscopic changes to the surface of your cervix (Pap test). You may be encouraged to  have this screening done every 3 years, beginning at age 83.  For women ages 35-65, health care providers may recommend pelvic exams and Pap testing every 3 years, or they may recommend the Pap and pelvic exam, combined with testing for human papilloma virus (HPV), every 5 years. Some types of HPV increase your risk of cervical cancer. Testing for HPV may also be done on women of any age with unclear Pap test results.  Other health care providers may not recommend any screening for nonpregnant women who are considered low risk for pelvic cancer and who do not have symptoms. Ask your health care provider if a screening pelvic exam is right for you.  If you have had past treatment for cervical cancer or a condition that could lead to cancer, you need Pap tests and screening for cancer for at least 20 years after your treatment. If Pap tests have been discontinued, your risk factors (such as having a new sexual partner) need to be reassessed to determine if screening should resume. Some women have medical problems that increase the chance of getting cervical cancer. In these cases, your health care provider may recommend more frequent screening and Pap tests.  Colorectal Cancer  This type of cancer can be detected and often prevented.  Routine colorectal cancer screening usually begins at 34 years of age and continues through 34 years of age.  Your health care provider may recommend screening at an earlier age if you have risk factors for colon cancer.  Your health care provider may also recommend using home test kits to check for hidden blood in the stool.  A small camera at the end of a tube can be used to examine your colon directly (sigmoidoscopy or colonoscopy). This is done to check for the earliest forms of colorectal cancer.  Routine screening usually begins at age 5.  Direct examination of the colon should be repeated every 5-10 years through 34 years of age. However, you may need to be  screened more often if  early forms of precancerous polyps or small growths are found.  Skin Cancer  Check your skin from head to toe regularly.  Tell your health care provider about any new moles or changes in moles, especially if there is a change in a mole's shape or color.  Also tell your health care provider if you have a mole that is larger than the size of a pencil eraser.  Always use sunscreen. Apply sunscreen liberally and repeatedly throughout the day.  Protect yourself by wearing long sleeves, pants, a wide-brimmed hat, and sunglasses whenever you are outside.  Heart disease, diabetes, and high blood pressure  High blood pressure causes heart disease and increases the risk of stroke. High blood pressure is more likely to develop in: ? People who have blood pressure in the high end of the normal range (130-139/85-89 mm Hg). ? People who are overweight or obese. ? People who are African American.  If you are 24-25 years of age, have your blood pressure checked every 3-5 years. If you are 55 years of age or older, have your blood pressure checked every year. You should have your blood pressure measured twice-once when you are at a hospital or clinic, and once when you are not at a hospital or clinic. Record the average of the two measurements. To check your blood pressure when you are not at a hospital or clinic, you can use: ? An automated blood pressure machine at a pharmacy. ? A home blood pressure monitor.  If you are between 10 years and 48 years old, ask your health care provider if you should take aspirin to prevent strokes.  Have regular diabetes screenings. This involves taking a blood sample to check your fasting blood sugar level. ? If you are at a normal weight and have a low risk for diabetes, have this test once every three years after 34 years of age. ? If you are overweight and have a high risk for diabetes, consider being tested at a younger age or more  often. Preventing infection Hepatitis B  If you have a higher risk for hepatitis B, you should be screened for this virus. You are considered at high risk for hepatitis B if: ? You were born in a country where hepatitis B is common. Ask your health care provider which countries are considered high risk. ? Your parents were born in a high-risk country, and you have not been immunized against hepatitis B (hepatitis B vaccine). ? You have HIV or AIDS. ? You use needles to inject street drugs. ? You live with someone who has hepatitis B. ? You have had sex with someone who has hepatitis B. ? You get hemodialysis treatment. ? You take certain medicines for conditions, including cancer, organ transplantation, and autoimmune conditions.  Hepatitis C  Blood testing is recommended for: ? Everyone born from [birth year] through 1967. ? Anyone with known risk factors for hepatitis C.  Sexually transmitted infections (STIs)  You should be screened for sexually transmitted infections (STIs) including gonorrhea and chlamydia if: ? You are sexually active and are younger than 34 years of age. ? You are older than 34 years of age and your health care provider tells you that you are at risk for this type of infection. ? Your sexual activity has changed since you were last screened and you are at an increased risk for chlamydia or gonorrhea. Ask your health care provider if you are at risk.  If you do not have HIV,  but are at risk, it may be recommended that you take a prescription medicine daily to prevent HIV infection. This is called pre-exposure prophylaxis (PrEP). You are considered at risk if: ? You are sexually active and do not regularly use condoms or know the HIV status of your partner(s). ? You take drugs by injection. ? You are sexually active with a partner who has HIV.  Talk with your health care provider about whether you are at high risk of being infected with HIV. If you choose to begin PrEP,  you should first be tested for HIV. You should then be tested every 3 months for as long as you are taking PrEP. Pregnancy  If you are premenopausal and you may become pregnant, ask your health care provider about preconception counseling.  If you may become pregnant, take 400 to 800 micrograms (mcg) of folic acid every day.  If you want to prevent pregnancy, talk to your health care provider about birth control (contraception). Osteoporosis and menopause  Osteoporosis is a disease in which the bones lose minerals and strength with aging. This can result in serious bone fractures. Your risk for osteoporosis can be identified using a bone density scan.  If you are 42 years of age or older, or if you are at risk for osteoporosis and fractures, ask your health care provider if you should be screened.  Ask your health care provider whether you should take a calcium or vitamin D supplement to lower your risk for osteoporosis.  Menopause may have certain physical symptoms and risks.  Hormone replacement therapy may reduce some of these symptoms and risks. Talk to your health care provider about whether hormone replacement therapy is right for you. Follow these instructions at home:  Schedule regular health, dental, and eye exams.  Stay current with your immunizations.  Do not use any tobacco products including cigarettes, chewing tobacco, or electronic cigarettes.  If you are pregnant, do not drink alcohol.  If you are breastfeeding, limit how much and how often you drink alcohol.  Limit alcohol intake to no more than 1 drink per day for nonpregnant women. One drink equals 12 ounces of beer, 5 ounces of wine, or 1 ounces of hard liquor.  Do not use street drugs.  Do not share needles.  Ask your health care provider for help if you need support or information about quitting drugs.  Tell your health care provider if you often feel depressed.  Tell your health care provider if you  have ever been abused or do not feel safe at home. This information is not intended to replace advice given to you by your health care provider. Make sure you discuss any questions you have with your health care provider. Document Released: 08/14/2010 Document Revised: 07/07/2015 Document Reviewed: 11/02/2014 [employer]vier Interactive Patient Education  [employer].

## 2017-10-18 NOTE — Progress Notes (Signed)
Patient ID: AFIA MESSENGER, female  DOB: [birth year], 32 y.o.   MRN: [id number] Patient Care Team    Relationship Specialty Notifications Start End  Ma Hillock, DO PCP - General Family Medicine  10/18/17   Brien Few, MD Consulting Physician Obstetrics and Gynecology  10/18/17     Chief Complaint  Patient presents with  . Establish Care    Subjective:  Sarah Mullins is a 32 y.o.  female present for new patient establishment. All past medical history, surgical history, allergies, family history, immunizations, medications and social history were updated in the electronic medical record today. All recent labs, ED visits and hospitalizations within the last year were reviewed.  Abnormal menses: Pt reports she finished breast feeding about 1 month ago and has not started her menstrual cycle. She is taking BCP and reports with her other children she normally started immediately. Her GYN is Dr. Ronita Hipps.   Health maintenance:  Colonoscopy: No fhx, routine screen at 48 Mammogram: cno fhx, routine screen at 47 (has GYN) Cervical cancer screening: last pap: 02/2017, results: normal, completed by: Dr. Ronita Hipps Immunizations: tdap 11/2016 UTD, Influenza provided today (encouraged yearly) Infectious disease screening: HIV completed DEXA: N/A Assistive device: none Oxygen TZG:YFVC Patient has a Dental home. Hospitalizations/ED visits:reviewed  Depression screen [facility] 2/9 10/18/2017 03/26/2016 03/22/2016  Decreased Interest 0 0 0  Down, Depressed, Hopeless 0 0 0  PHQ - 2 Score 0 0 0   No flowsheet data found.   Current Exercise Habits: Home exercise routine, Type of exercise: Other - see comments(running), Time (Minutes): 60, Frequency (Times/Week): 4, Weekly Exercise (Minutes/Week): 240, Intensity: Moderate   Fall Risk  10/17/2016 03/26/2016  Falls in the past year? No No   Immunization History  Administered Date(s) Administered  . Influenza Split 11/26/2011  .  Influenza,inj,Quad PF,6+ Mos 10/18/2017  . Influenza-Unspecified 01/14/2014  . Td 09/05/2009  . Tdap 01/04/2012, 11/12/2016   No exam data present  Past Medical History:  Diagnosis Date  . Chicken pox   . Instability of joint    left knee  . Raynaud's phenomenon    mild   Allergies  Allergen Reactions  . Beta Adrenergic Blockers     REACTION: circulation problems   Past Surgical History:  Procedure Laterality Date  . APPENDECTOMY  2002  . WISDOM TOOTH EXTRACTION     Family History  Problem Relation Age of Onset  . Uterine cancer Maternal Grandmother   . Dementia Paternal Grandfather        senile dementia or possible stroke  . Cervical cancer Mother   . Heart disease Maternal Grandfather   . Hearing loss Paternal Grandmother   . Hypertension Paternal Grandmother    Social History   Socioeconomic History  . Marital status: Married    Spouse name: Not on file  . Number of children: Not on file  . Years of education: Not on file  . Highest education level: Not on file  Occupational History  . Not on file  Social Needs  . Financial resource strain: Not on file  . Food insecurity:    Worry: Not on file    Inability: Not on file  . Transportation needs:    Medical: Not on file    Non-medical: Not on file  Tobacco Use  . Smoking status: Never Smoker  . Smokeless tobacco: Never Used  Substance and Sexual Activity  . Alcohol use: Yes  . Drug use: No  . Sexual activity:  Yes    Partners: Male    Comment: married  Lifestyle  . Physical activity:    Days per week: Not on file    Minutes per session: Not on file  . Stress: Not on file  Relationships  . Social connections:    Talks on phone: Not on file    Gets together: Not on file    Attends religious service: Not on file    Active member of club or organization: Not on file    Attends meetings of clubs or organizations: Not on file    Relationship status: Not on file  . Intimate partner violence:     Fear of current or ex partner: Not on file    Emotionally abused: Not on file    Physically abused: Not on file    Forced sexual activity: Not on file  Other Topics Concern  . Not on file  Social History Narrative   Marital status/children/pets: Married, 4 children   Education/employment: Master's degree. SAHM.    Safety:      -Wears a bicycle helmet riding a bike: Yes     -smoke alarm in the home:Yes     - wears seatbelt: Yes     - Feels safe in their relationships: Yes   Allergies as of 10/18/2017      Reactions   Beta Adrenergic Blockers    REACTION: circulation problems      Medication List        Accurate as of 10/18/17 10:01 AM. Always use your most recent med list.          CAMILA 0.35 MG tablet Generic drug:  norethindrone Take 1 tablet by mouth daily.   multivitamin capsule Take 1 capsule by mouth daily.       All past medical history, surgical history, allergies, family history, immunizations andmedications were updated in the EMR today and reviewed under the history and medication portions of their EMR.    No results found for this or any previous visit (from the past 2160 hour(s)).  No results found.   ROS: 14 pt review of systems performed and negative (unless mentioned in an HPI)  Objective: BP 95/60 (BP Location: Right Arm, Patient Position: Sitting, Cuff Size: Normal)   Pulse 68   Temp 98.3 F (36.8 C)   Resp 20   Ht _0  (1.575 m)   Wt 122 lb (55.3 kg)   SpO2 98%   Breastfeeding? No   BMI 22.31 kg/m  Gen: Afebrile. No acute distress. Nontoxic in appearance, well-developed, well-nourished,  Pleasant caucasian female.  HENT: AT. [place]. Bilateral TM visualized and normal in appearance, normal external auditory canal. MMM, no oral lesions, adequate dentition. Bilateral nares within normal limits. Throat without erythema, ulcerations or exudates. no Cough on exam, no hoarseness on exam. Eyes:Pupils Equal Round Reactive to light, Extraocular movements  intact,  Conjunctiva without redness, discharge or icterus. Neck/lymp/endocrine: Supple,no lymphadenopathy, no thyromegaly CV: RRR no murmur, no edema, +2/4 P posterior tibialis pulses. no carotid bruits. No JVD. Chest: CTAB, no wheeze, rhonchi or crackles. normal Respiratory effort. good Air movement. Abd: Soft. flat. NTND. BS present. no Masses palpated. No hepatosplenomegaly. No rebound tenderness or guarding. Skin: no rashes, purpura or petechiae. Warm and well-perfused. Skin intact. Neuro/Msk:  Normal gait. PERLA. EOMi. Alert. Oriented x3.  Cranial nerves II through XII intact. Muscle strength 5/5 upper/lower extremity. DTRs equal bilaterally. Psych: Normal affect, dress and demeanor. Normal speech. Normal thought content and judgment.  Assessment/plan: Sarah Mullins is a 32 y.o. female present for est/cpe Immunization due - Flu Vaccine QUAD 6+ mos PF IM (Fluarix Quad PF) Amenorrhea - lab work collected today r/o thyroid.  - CBC w/Diff - Comp Met (CMET) - TSH - If TSh normal and menses is missed for 2 consecutive cycles encouraged her to schedule with her GYN Encounter for long-term current use of medication - BCP - CBC w/Diff - Comp Met (CMET) Encounter for preventive health examination Patient was encouraged to exercise greater than 150 minutes a week. Patient was encouraged to choose a diet filled with fresh fruits and vegetables, and lean meats. AVS provided to patient today for education/recommendation on gender specific health and safety maintenance. Colonoscopy: No fhx, routine screen at 6 Mammogram: cno fhx, routine screen at 29 (has GYN) Cervical cancer screening: last pap: 02/2017, results: normal, completed by: Dr. Ronita Hipps Immunizations: tdap 11/2016 UTD, Influenza provided today (encouraged yearly) Infectious disease screening: HIV completed DEXA: N/A  Return in about 1 year (around 10/19/2018) for CPE.   Note is dictated utilizing voice recognition software.  Although note has been proof read prior to signing, occasional typographical errors still can be missed. If any questions arise, please do not hesitate to call for verification.  Electronically signed by: Howard Pouch, DO [place]

## 2017-11-29 DIAGNOSIS — R760 Raised antibody titer: Secondary | ICD-10-CM | POA: Diagnosis not present

## 2018-02-12 DIAGNOSIS — E042 Nontoxic multinodular goiter: Secondary | ICD-10-CM

## 2018-02-12 HISTORY — DX: Nontoxic multinodular goiter: E04.2

## 2018-03-18 DIAGNOSIS — Z113 Encounter for screening for infections with a predominantly sexual mode of transmission: Secondary | ICD-10-CM | POA: Diagnosis not present

## 2018-03-18 DIAGNOSIS — Z01419 Encounter for gynecological examination (general) (routine) without abnormal findings: Secondary | ICD-10-CM | POA: Diagnosis not present

## 2018-03-18 DIAGNOSIS — Z124 Encounter for screening for malignant neoplasm of cervix: Secondary | ICD-10-CM | POA: Diagnosis not present

## 2018-03-18 DIAGNOSIS — Z1151 Encounter for screening for human papillomavirus (HPV): Secondary | ICD-10-CM | POA: Diagnosis not present

## 2018-03-18 DIAGNOSIS — Z6822 Body mass index (BMI) 22.0-22.9, adult: Secondary | ICD-10-CM | POA: Diagnosis not present

## 2018-04-03 DIAGNOSIS — Z13220 Encounter for screening for lipoid disorders: Secondary | ICD-10-CM | POA: Diagnosis not present

## 2018-09-23 ENCOUNTER — Telehealth: Payer: Self-pay | Admitting: Family Medicine

## 2018-09-23 ENCOUNTER — Other Ambulatory Visit: Payer: Self-pay

## 2018-09-23 ENCOUNTER — Ambulatory Visit (INDEPENDENT_AMBULATORY_CARE_PROVIDER_SITE_OTHER): Payer: BC Managed Care – PPO | Admitting: Family Medicine

## 2018-09-23 ENCOUNTER — Ambulatory Visit (HOSPITAL_BASED_OUTPATIENT_CLINIC_OR_DEPARTMENT_OTHER): Payer: BC Managed Care – PPO

## 2018-09-23 ENCOUNTER — Other Ambulatory Visit (HOSPITAL_BASED_OUTPATIENT_CLINIC_OR_DEPARTMENT_OTHER): Payer: BLUE CROSS/BLUE SHIELD

## 2018-09-23 ENCOUNTER — Encounter: Payer: Self-pay | Admitting: Family Medicine

## 2018-09-23 VITALS — Temp 97.7°F | Wt 119.2 lb

## 2018-09-23 DIAGNOSIS — R221 Localized swelling, mass and lump, neck: Secondary | ICD-10-CM

## 2018-09-23 NOTE — Telephone Encounter (Signed)
Patient reports she has a lump near lymph nodes on ride side of neck. No fever. Throat hurts. No other symptoms. States that her daughter was throwing all day yesterday but seems to be fine this morning. Requesting appt to come in.  In office appt or virtual visit?  Please advise and contact [phone].

## 2018-09-23 NOTE — Progress Notes (Signed)
VIRTUAL VISIT VIA VIDEO  I connected with Sarah Mullins on 09/23/18 at  2:00 PM EDT by a video enabled telemedicine application and verified that I am speaking with the correct person using two identifiers. Location patient: Home Location provider: [facility] [place], Office Persons participating in the virtual visit: Patient, Dr. Claiborne BillingsKuneff and R.Baker, LPN  I discussed the limitations of evaluation and management by telemedicine and the availability of in person appointments. The patient expressed understanding and agreed to proceed.   SUBJECTIVE Chief Complaint  Patient presents with  . Sore Throat    Pts glands are swollen in neck and feels there is a bulge or bump right at her thyroid x1 day.     HPI: Sarah Mullins is a 33 y.o. female present for mild sore throat and a new neck mass x1 day.  Patient reports she noticed a mild sore throat and her lymph glands were swollen on the right side of her neck.  However she noticed a rather large mass on the right anterior portion of her neck near her thyroid.  She states it is not extremely tender but it is uncomfortable.  She feels it is a "hard "mass.  She denies any fever, chills, nausea, vomit, exudates, erythema of her throat, ear pain, nasal congestion or cough.  She is eating and drinking her normal.  Her daughter did have vomiting yesterday, but he is improved today.  ROS: See pertinent positives and negatives per HPI.  There are no active problems to display for this patient.   Social History   Tobacco Use  . Smoking status: Never Smoker  . Smokeless tobacco: Never Used  Substance Use Topics  . Alcohol use: Yes    Current Outpatient Medications:  .  b complex vitamins tablet, Take 1 tablet by mouth daily., Disp: , Rfl:  .  Multiple Vitamin (MULTIVITAMIN) capsule, Take 1 capsule by mouth daily., Disp: , Rfl:  .  CAMILA 0.35 MG tablet, Take 1 tablet by mouth daily., Disp: , Rfl: 6  Allergies  Allergen Reactions  .  Beta Adrenergic Blockers     REACTION: circulation problems    OBJECTIVE: Temp 97.7 F (36.5 C) (Oral)   Wt 119 lb 4 oz (54.1 kg)   LMP 09/19/2018 (Exact Date)   BMI 21.81 kg/m   Gen: No acute distress. Nontoxic in appearance.  HENT: AT. [place].  MMM.  Rather significant golf ball size mass visible right anterior neck just inferior to thyroid level. Eyes:Pupils Equal Round Reactive to light, Extraocular movements intact,  Conjunctiva without redness, discharge or icterus. Chest: Cough or shortness of breath not present Skin: No erythema , rashes, purpura or petechiae.  Neuro: Alert. Oriented x3  Psych: Normal affect, dress and demeanor. Normal speech. Normal thought content and judgment.  ASSESSMENT AND PLAN: Sarah Mullins is a 33 y.o. female present for  Neck mass -Rather significant neck mass easily visible through video visit.  Considered lymphadenopathy versus thyroid etiology versus thyroglossal duct cyst development.  Will obtain a ultrasound of her neck/thyroid ASAP.  Currently not treating compression symptoms, but if enlarges may become more of an issue. -Like not to bring her in for labs or treat with prescribed medications until etiology is confirmed by ultrasound. -Encouraged her to use ibuprofen, antihistamines for OTC treatment. - US SOFT TISSUE HEAD & NECK (NON-THYROID); Future - US THYROID; Future -Follow-up will be decided once ultrasound is received.  She was encouraged if her symptoms change and she  has difficulty swallowing, increased pain, chills or fever she is to call and to be seen immediately or report to the emergency room.  > 25 minutes spent with patient, >50% of time spent face to face      Howard Pouch, DO 09/23/2018

## 2018-09-23 NOTE — Telephone Encounter (Signed)
This will need to be a VV please   Thanks

## 2018-09-23 NOTE — Telephone Encounter (Signed)
Scheduled virtual appt for today with Dr. Raoul Pitch

## 2018-09-24 ENCOUNTER — Other Ambulatory Visit (HOSPITAL_BASED_OUTPATIENT_CLINIC_OR_DEPARTMENT_OTHER): Payer: BLUE CROSS/BLUE SHIELD

## 2018-09-24 ENCOUNTER — Ambulatory Visit (HOSPITAL_BASED_OUTPATIENT_CLINIC_OR_DEPARTMENT_OTHER)
Admission: RE | Admit: 2018-09-24 | Discharge: 2018-09-24 | Disposition: A | Discharge status: Home / Self Care | Payer: BC Managed Care – PPO | Source: Ambulatory Visit | Attending: Family Medicine | Admitting: Family Medicine

## 2018-09-24 ENCOUNTER — Other Ambulatory Visit: Payer: Self-pay

## 2018-09-24 DIAGNOSIS — E010 Iodine-deficiency related diffuse (endemic) goiter: Secondary | ICD-10-CM | POA: Diagnosis not present

## 2018-09-24 DIAGNOSIS — R221 Localized swelling, mass and lump, neck: Secondary | ICD-10-CM | POA: Insufficient documentation

## 2018-09-24 DIAGNOSIS — E042 Nontoxic multinodular goiter: Secondary | ICD-10-CM | POA: Diagnosis not present

## 2018-09-25 ENCOUNTER — Encounter: Payer: Self-pay | Admitting: Family Medicine

## 2018-09-25 ENCOUNTER — Telehealth: Payer: Self-pay | Admitting: Family Medicine

## 2018-09-25 DIAGNOSIS — E041 Nontoxic single thyroid nodule: Secondary | ICD-10-CM

## 2018-09-25 NOTE — Telephone Encounter (Signed)
Patient was called concerning her ultrasound results. Her ultrasound resulted with a thyroid nodule of the right side of her thyroid.  There were no lymph nodes or cysts visualized. She actually has 2 nodules of her right thyroid with one measuring 1.3 cm and 1 measuring 3.8 cm. A biopsy is indicated on the larger thyroid nodule to further investigate.  Patient was allowed to ask questions, questions were answered. Lab appointment set up.  Endocrine referral placed.  Thyroid biopsy under ultrasound ordered.

## 2018-09-26 ENCOUNTER — Telehealth: Payer: Self-pay | Admitting: Family Medicine

## 2018-09-26 ENCOUNTER — Other Ambulatory Visit: Payer: Self-pay

## 2018-09-26 DIAGNOSIS — E041 Nontoxic single thyroid nodule: Secondary | ICD-10-CM

## 2018-09-26 NOTE — Telephone Encounter (Signed)
Please enter another order for [place] to be performed at [facility]. The previous order was used to schedule at [place] which is too far out.

## 2018-09-26 NOTE — Telephone Encounter (Signed)
Pt wrote the following my chart message: Hi Dr. Raoul Pitch,    I've thought of a few more questions. First, is there anything you recommend taking to reduce the achy tightness from my ear to the back of my neck? Its not painful painful just uncomfortable and constant. It kind of gives me a head ache by the end of the day.   Next, [facility] called and their first available appointment is September 15th. Of course I made the appointment but that seems kind of far away. Is there anywhere else that might have something sooner even if I had to travel a little? And/or do you know if it would something I could be on a cancelation list just in case something opened up sooner?  Lastly, are the nodules both on the same side of the thyroid? At this point (I understand with more information things will change) do you think removal would be just the nodules or would the thyroid have to be removed in order to remove the nodules?  Thank you, [employer]  Please advise

## 2018-09-26 NOTE — Addendum Note (Signed)
Addended by: Caroll Rancher L on: 09/26/2018 04:42 PM   Modules accepted: Orders

## 2018-09-26 NOTE — Telephone Encounter (Signed)
I spoke with scheduler at [facility]. She may be able to get the patient in within 2 weeks if she has an order for [facility]. The order is correct and can be a duplicate of the order that was placed for [facility].Thank you

## 2018-09-26 NOTE — Telephone Encounter (Signed)
Sarah Mullins this has been placed

## 2018-09-26 NOTE — Telephone Encounter (Signed)
So has this been ordered for a [place]? What do we need to do?

## 2018-09-30 NOTE — Telephone Encounter (Signed)
Spoke with Forestine Na scheduler 09/29/18. She advised that order has to be reviewed by technician before it can be scheduled. They will call patient as soon as it has been reviewed. Patient has been advised.

## 2018-10-01 ENCOUNTER — Encounter: Payer: Self-pay | Admitting: Family Medicine

## 2018-10-01 ENCOUNTER — Other Ambulatory Visit: Payer: Self-pay

## 2018-10-01 ENCOUNTER — Ambulatory Visit (INDEPENDENT_AMBULATORY_CARE_PROVIDER_SITE_OTHER): Payer: BC Managed Care – PPO | Admitting: Family Medicine

## 2018-10-01 DIAGNOSIS — E041 Nontoxic single thyroid nodule: Secondary | ICD-10-CM | POA: Diagnosis not present

## 2018-10-01 LAB — T4, FREE: Free T4: 0.83 ng/dL (ref 0.60–1.60)

## 2018-10-01 LAB — T3, FREE: T3, Free: 3.3 pg/mL (ref 2.3–4.2)

## 2018-10-01 LAB — TSH: TSH: 0.68 u[IU]/mL (ref 0.35–4.50)

## 2018-10-02 ENCOUNTER — Ambulatory Visit (HOSPITAL_COMMUNITY): Admission: RE | Admit: 2018-10-02 | Payer: BC Managed Care – PPO | Source: Ambulatory Visit

## 2018-10-02 ENCOUNTER — Telehealth: Payer: Self-pay

## 2018-10-02 ENCOUNTER — Encounter: Payer: Self-pay | Admitting: Internal Medicine

## 2018-10-02 NOTE — Telephone Encounter (Signed)
Pt called and LM on nurse VM. Discomfort is getting worse this AM with more swelling. Pt can see the swelling and it feels tight and seems hard to clear throat but denies any trouble breathing. Pt received phone call from radiologist stating they could not do biopsy at [facility] and she would need to go to [facility]. She is scheduled Monday at 12:30pm. Pt is not sure why they would not perform at [facility]. Pt was advised to go to ED given that she is having swelling and hard time clearing throat. She was educated on airway and how this can be an emergency and needs to be checked ASAP. She agreed and will go to ED.

## 2018-10-02 NOTE — Telephone Encounter (Signed)
Discomfort and more swelling in neck**

## 2018-10-06 ENCOUNTER — Telehealth: Payer: Self-pay | Admitting: Family Medicine

## 2018-10-06 ENCOUNTER — Other Ambulatory Visit: Payer: Self-pay

## 2018-10-06 ENCOUNTER — Ambulatory Visit (HOSPITAL_COMMUNITY)
Admission: RE | Admit: 2018-10-06 | Discharge: 2018-10-06 | Disposition: A | Discharge status: Home / Self Care | Payer: BC Managed Care – PPO | Source: Ambulatory Visit | Attending: Family Medicine | Admitting: Family Medicine

## 2018-10-06 DIAGNOSIS — E041 Nontoxic single thyroid nodule: Secondary | ICD-10-CM | POA: Insufficient documentation

## 2018-10-06 MED ORDER — LIDOCAINE HCL (PF) 1 % IJ SOLN
INTRAMUSCULAR | Status: AC
  Filled 2018-10-06: qty 30

## 2018-10-06 NOTE — Telephone Encounter (Signed)
Pt was called and given lab results. Pt verbalized understanding

## 2018-10-06 NOTE — Telephone Encounter (Signed)
Please inform patient the following information: Please inform patient her blood tests for her thyroid are all normal.

## 2018-10-09 ENCOUNTER — Telehealth: Payer: Self-pay | Admitting: Family Medicine

## 2018-10-09 ENCOUNTER — Other Ambulatory Visit: Payer: Self-pay

## 2018-10-09 NOTE — Telephone Encounter (Signed)
Pt was called given results. She verbalized understanding. Pt states by that same afternoon she felt a lot better and the pressure she was feeling was gone. Pt was encouraged to keep appt with endo.

## 2018-10-09 NOTE — Telephone Encounter (Signed)
Please inform patient the following information: Her biopsy results are consistent with a cyst material only. Hopefully, the biopsy and collection of fluid released some of the pressure she was feeling in her neck. Is she feeling improved? She says she has her endocrine appointment set up for September 1.  I do encourage her to still attend that appointment since the other nodules will need routine follow-up ultrasounds.

## 2018-10-14 ENCOUNTER — Encounter: Payer: Self-pay | Admitting: Internal Medicine

## 2018-10-14 ENCOUNTER — Encounter

## 2018-10-14 ENCOUNTER — Other Ambulatory Visit: Payer: Self-pay

## 2018-10-14 ENCOUNTER — Ambulatory Visit: Payer: BC Managed Care – PPO | Admitting: Internal Medicine

## 2018-10-14 VITALS — BP 106/70 | HR 60 | Temp 98.2°F | Ht 62.0 in | Wt 121.0 lb

## 2018-10-14 DIAGNOSIS — E041 Nontoxic single thyroid nodule: Secondary | ICD-10-CM

## 2018-10-14 NOTE — Progress Notes (Signed)
Name: Sarah Mullins  MRN/ DOB: [id number], [birth year]    Age/ Sex: 33 y.o., female    PCP: Sarah Hillock, DO   Reason for Endocrinology Evaluation: Thyroid nodules     Date of Initial Endocrinology Evaluation: 10/14/2018     HPI: Sarah Mullins is Mullins 33 y.o. female with unremarkable  past medical history. The patient presented for initial endocrinology clinic visit on 10/14/2018 for consultative assistance with her thyroid nodules  Pt presented to her PCP in 09/2018 with c/o right anterior neck mass, she was having Mullins sore throat at that time and noted the swelling. This prompted Mullins thyroid ultrasound which showed Mullins right inferior cysts 3.8 cm meeting criteria for FNA  She is S/P FNA in 09/2018  Prior to the FNA she was having anterior neck issues with discomfort and subjective shortness of breath, as well as right ear aches with headaches.  After the FNA she has felt so much better   Today she denies any neck  Pain or discomfort, no dysphagia, no hoarseness    No FH of thyroid problems.     HISTORY:  Past Medical History:  Past Medical History:  Diagnosis Date  . Chicken pox   . Chronic constipation   . Instability of joint    left knee  . Raynaud's phenomenon    mild   Past Surgical History:  Past Surgical History:  Procedure Laterality Date  . APPENDECTOMY  2002  . WISDOM TOOTH EXTRACTION        Social History:  reports that she has never smoked. She has never used smokeless tobacco. She reports current alcohol use. She reports that she does not use drugs.  Family History: family history includes Cervical cancer in her mother; Dementia in her paternal grandfather; Hearing loss in her paternal grandmother; Heart disease in her maternal grandfather; Hypertension in her paternal grandmother; Uterine cancer in her maternal grandmother.   HOME MEDICATIONS: Allergies as of 10/14/2018      Reactions   Beta Adrenergic Blockers    REACTION: circulation  problems      Medication List       Accurate as of October 14, 2018  1:50 PM. If you have any questions, ask your nurse or doctor.        b complex vitamins tablet Take 1 tablet by mouth daily.   Camila 0.35 MG tablet Generic drug: norethindrone Take 1 tablet by mouth daily.   multivitamin capsule Take 1 capsule by mouth daily.         REVIEW OF SYSTEMS: Mullins comprehensive ROS was conducted with the patient and is negative except as per HPI and below:  Review of Systems  Constitutional: Negative for fever and weight loss.  HENT: Negative for congestion and sore throat.   Respiratory: Negative for cough and shortness of breath.   Cardiovascular: Negative for chest pain and palpitations.  Gastrointestinal: Negative for nausea and vomiting.  Musculoskeletal: Negative for neck pain.       OBJECTIVE:  VS: BP 106/70 (BP Location: Left Arm, Patient Position: Sitting, Cuff Size: Normal)   Pulse 60   Temp 98.2 F (36.8 C)   Ht 5\' 2"  (1.575 m)   Wt 121 lb (54.9 kg)   LMP 09/19/2018 (Exact Date)   SpO2 99%   BMI 22.13 kg/m    Wt Readings from Last 3 Encounters:  10/14/18 121 lb (54.9 kg)  09/23/18 119 lb 4 oz (54.1 kg)  10/18/17 122  lb (55.3 kg)     EXAM: General: Pt appears well and is in NAD  Hydration: Well-hydrated with moist mucous membranes and good skin turgor  Eyes: External eye exam normal without stare, lid lag or exophthalmos.  EOM intact.    Ears, Nose, Throat: Hearing: Grossly intact bilaterally Dental: Good dentition  Throat: Clear without mass, erythema or exudate  Neck: General: Supple without adenopathy. Thyroid: Thyroid size normal.  No goiter or nodules appreciated. No thyroid bruit.  Lungs: Clear with good BS bilat with no rales, rhonchi, or wheezes  Heart: Auscultation: RRR.  Abdomen: Normoactive bowel sounds, soft, nontender, without masses or organomegaly palpable  Extremities:  BL LE: No pretibial edema normal ROM and strength.  Skin:  Hair: Texture and amount normal with gender appropriate distribution Skin Inspection: No rashes Skin Palpation: Skin temperature, texture, and thickness normal to palpation  Neuro: Cranial nerves: II - XII grossly intact  Motor: Normal strength throughout DTRs: 2+ and symmetric in UE without delay in relaxation phase  Mental Status: Judgment, insight: Intact Orientation: Oriented to time, place, and person Mood and affect: No depression, anxiety, or agitation     DATA REVIEWED: Results for Madelyn BrunnerBIZIER, Sujata L (MRN [id number]) as of 10/14/2018 09:34  Ref. Range 10/01/2018 13:48  TSH Latest Ref Range: 0.35 - 4.50 uIU/mL 0.68  Triiodothyronine,Free,Serum Latest Ref Range: 2.3 - 4.2 pg/mL 3.3  T4,Free(Direct) Latest Ref Range: 0.60 - 1.60 ng/dL 7.820.83     FNA 9/56/21308/24/2020 THYROID, FINE NEEDLE ASPIRATION, RIGHT INFERIOR CYSTIC NODULE (SPECIMEN 1 OF 1, COLLECTED 10/06/18): SCANT FOLLICULAR EPITHELIUM PRESENT (BETHESDA CATEGORY I). FINDINGS CONSISTENT WITH THE CONTENTS OF Mullins CYST. ASSESSMENT/PLAN/RECOMMENDATIONS:   1. Right thyroid nodules:   - Clinically and biochemically euthyroid - S/P FNA of the right inferior 3.8 cm colloid cyst with scan cellularity which is not unusual given it was mainly fluid filled cysts with very minimal solid tissue.  - Reassurance provided.  - I explained to her that its not uncommon for thyroid cysts to re-accumulate fluid, she will have the option of having it drained vs proceeding with right lobectomy should she become symptomatic.  - Will repeat Mullins thyroid ultrasound in Mullins year   F/u in 1 yr     Signed electronically by: Sarah HerrlichAbby Jaralla [name], Sarah Mullins  [facility]  [facility] [address] [place], [place] [address] Phone: [phone] FAX: [phone]   CC: Sarah Mullins, Sarah A, DO [address] [place] [address] Phone: [phone] Fax: [phone]   Return to Endocrinology clinic as below: Future Appointments   Date Time Provider Department Center  10/14/2019  1:00 PM Latavia Goga, Konrad DoloresIbtehal Jaralla, Sarah Mullins [facility] None

## 2018-10-14 NOTE — Patient Instructions (Signed)
-   Your thyroid nodules are mainly formed of liquid. Your right lower nodule may enlarge again due to fluid re -accumulation which is not unusual.    - Please contact us should you notice that you are having anterior neck pain/discomfort, difficulty breathing or difficulty swallowing for a repeat ultrasound.     - Will repeat ultrasound in a year

## 2018-10-28 ENCOUNTER — Other Ambulatory Visit: Payer: BC Managed Care – PPO

## 2019-01-27 DIAGNOSIS — Z03818 Encounter for observation for suspected exposure to other biological agents ruled out: Secondary | ICD-10-CM | POA: Diagnosis not present

## 2019-01-27 DIAGNOSIS — Z20828 Contact with and (suspected) exposure to other viral communicable diseases: Secondary | ICD-10-CM | POA: Diagnosis not present

## 2019-06-24 DIAGNOSIS — Z20828 Contact with and (suspected) exposure to other viral communicable diseases: Secondary | ICD-10-CM | POA: Diagnosis not present

## 2019-06-24 DIAGNOSIS — J00 Acute nasopharyngitis [common cold]: Secondary | ICD-10-CM | POA: Diagnosis not present

## 2019-06-24 DIAGNOSIS — Z03818 Encounter for observation for suspected exposure to other biological agents ruled out: Secondary | ICD-10-CM | POA: Diagnosis not present

## 2019-07-08 DIAGNOSIS — Z01419 Encounter for gynecological examination (general) (routine) without abnormal findings: Secondary | ICD-10-CM | POA: Diagnosis not present

## 2019-07-08 DIAGNOSIS — Z6822 Body mass index (BMI) 22.0-22.9, adult: Secondary | ICD-10-CM | POA: Diagnosis not present

## 2019-07-20 ENCOUNTER — Ambulatory Visit: Payer: BC Managed Care – PPO | Admitting: Family Medicine

## 2019-07-20 ENCOUNTER — Encounter: Payer: Self-pay | Admitting: Family Medicine

## 2019-07-20 ENCOUNTER — Ambulatory Visit (INDEPENDENT_AMBULATORY_CARE_PROVIDER_SITE_OTHER): Payer: BC Managed Care – PPO

## 2019-07-20 ENCOUNTER — Other Ambulatory Visit: Payer: Self-pay

## 2019-07-20 VITALS — BP 108/69 | HR 64 | Temp 98.5°F | Resp 16 | Ht 62.0 in | Wt 127.2 lb

## 2019-07-20 DIAGNOSIS — M25561 Pain in right knee: Secondary | ICD-10-CM

## 2019-07-20 NOTE — Progress Notes (Signed)
This visit occurred during the SARS-CoV-2 public health emergency.  Safety protocols were in place, including screening questions prior to the visit, additional usage of staff PPE, and extensive cleaning of exam room while observing appropriate contact time as indicated for disinfecting solutions.    Sarah Mullins , 06-07-1985, 34 y.o., female MRN: [id number] Patient Care Team    Relationship Specialty Notifications Start End  Ma Hillock, DO PCP - General Family Medicine  10/18/17   Brien Few, MD Consulting Physician Obstetrics and Gynecology  10/18/17     Chief Complaint  Patient presents with  . Knee Pain    R knee pain since Thursday afternoon. Brusing and swelling. Had kneeled down Thursday afternoon had severe pain.      Subjective: Pt presents for an OV with complaints of right knee pain of 5 days duration.  Associated symptoms include swelling and bruising.  Patient reports she knelt down on her right knee on Thursday and noticed a sharp pain that felt like her knee was bruised.  She reports her knee was swollen already when she visualized it after kneeling.  She does not recall an injury or discomfort prior to kneeling.  She states it did not bruise until 3 days after kneeling on it on Saturday.  Currently she states the swelling has improved, although it still swollen.  She currently reports feeling like the knee is stiff.  She is a runner, her last run was last Tuesday (2 days prior to feeling pain with kneeling).  She does have a history of knee instability in her right knee for many years after playing softball. Pt has tried Aleve to ease their symptoms.   Depression screen [facility] 2/9 10/18/2017 03/26/2016 03/22/2016  Decreased Interest 0 0 0  Down, Depressed, Hopeless 0 0 0  PHQ - 2 Score 0 0 0    Allergies  Allergen Reactions  . Beta Adrenergic Blockers     REACTION: circulation problems   Social History   Social History Narrative   Marital  status/children/pets: Married, 4 children   Education/employment: Master's degree. SAHM.    Safety:      -Wears a bicycle helmet riding a bike: Yes     -smoke alarm in the home:Yes     - wears seatbelt: Yes     - Feels safe in their relationships: Yes   Past Medical History:  Diagnosis Date  . Chicken pox   . Chronic constipation   . Instability of joint    right knee  . Raynaud's phenomenon    mild   Past Surgical History:  Procedure Laterality Date  . APPENDECTOMY  2002  . WISDOM TOOTH EXTRACTION     Family History  Problem Relation Age of Onset  . Uterine cancer Maternal Grandmother   . Dementia Paternal Grandfather        senile dementia or possible stroke  . Cervical cancer Mother   . Heart disease Maternal Grandfather   . Hearing loss Paternal Grandmother   . Hypertension Paternal Grandmother    Allergies as of 07/20/2019      Reactions   Beta Adrenergic Blockers    REACTION: circulation problems      Medication List       Accurate as of July 20, 2019  5:19 PM. If you have any questions, ask your nurse or doctor.        STOP taking these medications   Camila 0.35 MG tablet Generic drug: norethindrone Stopped  by: Felix Pacini, DO     TAKE these medications   b complex vitamins tablet Take 1 tablet by mouth daily.   multivitamin capsule Take 1 capsule by mouth daily.   naproxen sodium 220 MG tablet Commonly known as: ALEVE Take 220 mg by mouth daily as needed.       All past medical history, surgical history, allergies, family history, immunizations andmedications were updated in the EMR today and reviewed under the history and medication portions of their EMR.     ROS: Negative, with the exception of above mentioned in HPI   Objective:  BP 108/69 (BP Location: Left Arm, Patient Position: Sitting, Cuff Size: Normal)   Pulse 64   Temp 98.5 F (36.9 C) (Temporal)   Resp 16   Ht 5\' 2"  (1.575 m)   Wt 127 lb 4 oz (57.7 kg)   LMP 07/17/2019  (Exact Date)   SpO2 99%   BMI 23.27 kg/m  Body mass index is 23.27 kg/m. Gen: Afebrile. No acute distress. Nontoxic in appearance, well developed, well nourished.  HENT: AT. [place].  MSK: No erythema.  Mild soft tissue swelling medial aspect of knee.  Small effusion present.  Bruising present over medial femoral epicondyle and kneecap.  Tender to palpation posterior knee and medial to the kneecap.  No joint line tenderness.  Full extension present, mild crepitus present.  No ligament laxity.  Pain and decreased range of motion in flexion.  Neurovascularly intact distally. Skin: no rashes, purpura or petechiae.  Bruising superior medial aspect of knee. Neuro: Normal gait. PERLA. EOMi. Alert. Oriented x3  Psych: Normal affect, dress and demeanor. Normal speech. Normal thought content and judgment.  No exam data present No results found. No results found for this or any previous visit (from the past 24 hour(s)).  Assessment/Plan: Sarah Mullins is a 34 y.o. female present for OV for  Acute pain of right knee Patient is a runner, ran 2 days prior to noticing pain and swelling-which was only notable after kneeling on her knee.  She does endorse swelling has improved some over this time.  Additional symptoms of new bruising started 2-3 days after kneeling on area. Mild swelling superior medial compartment of knee with bruising over area.  Tender to palpation in this area.  No joint line tenderness. Concern for plica, chondromalacia versus avulsion fracture of medial femoral condyle. Rest-no running for at least 2 weeks, possibly longer depending upon clinical outcome. Compression-discussed neoprene compression sleeve with her today. Continue NSAIDs OTC.  Which seem to be helping. May ice if flares. - DG Knee Complete 4 Views Right; Future Follow-up dependent upon x-ray results.  Consider referral to orthopedics versus sports medicine dependent upon those results.   Reviewed expectations re:  course of current medical issues.  Discussed self-management of symptoms.  Outlined signs and symptoms indicating need for more acute intervention.  Patient verbalized understanding and all questions were answered.  Patient received an After-Visit Summary.    Orders Placed This Encounter  Procedures  . DG Knee Complete 4 Views Right   No orders of the defined types were placed in this encounter.  Referral Orders  No referral(s) requested today     Note is dictated utilizing voice recognition software. Although note has been proof read prior to signing, occasional typographical errors still can be missed. If any questions arise, please do not hesitate to call for verification.   electronically signed by:  31, DO   Primary Care -  OR

## 2019-07-20 NOTE — Patient Instructions (Addendum)
I will call you with xray results and guide you on further plan.  Rest. Keep a compression sleeve on knee. Aleve twice a day for pain and swelling.   [facility] is located at [address], [place], [place] [address]

## 2019-07-21 ENCOUNTER — Telehealth: Payer: Self-pay | Admitting: Family Medicine

## 2019-07-21 DIAGNOSIS — M25561 Pain in right knee: Secondary | ICD-10-CM

## 2019-07-21 MED ORDER — MELOXICAM 15 MG PO TABS: 15.0000 mg | ORAL_TABLET | Freq: Every day | ORAL | 0 refills | Status: DC

## 2019-07-21 NOTE — Telephone Encounter (Signed)
Pt was called and given information/results. She agreed to take Mobic. Pt stated that her daughter and husband both had tested + for Lymes disease and had similar joint pain and she would like to be tested just to make sure it is not that.

## 2019-07-21 NOTE — Telephone Encounter (Signed)
Please inform patient the following information: Her x-ray is without acute cause of her discomfort.  There are no fractures, bone spurs or malalignment that would suggest possible meniscal injury. X-rays only show [place] bone structures well. 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 In the meantime continue rest (avoid running, squatting, kneeling etc), wear compression sleeve we spoke of, anti-inflammatories for swelling and comfort.   -I did call in a medication called meloxicam (Mobic).  This is a once daily anti-inflammatory.  Medication should be taken with food.  She may use this in place of her over-the-counter NSAIDs if she desires.  I would recommend she take Mobic once a day for at least 2 weeks and follow-up with sports med for further recommendations. They will call her to schedule.

## 2019-07-22 ENCOUNTER — Telehealth: Payer: Self-pay

## 2019-07-22 NOTE — Telephone Encounter (Signed)
Please call regarding having some additional testing done for lyme disease. Please call

## 2019-07-23 NOTE — Telephone Encounter (Signed)
See other phone note

## 2019-07-23 NOTE — Telephone Encounter (Signed)
Her presentation of knee pain was not consistent with the usual presentation of Lyme arthritis or Lyme septic joint. If she is desiring testing we can order the test for her.  She can have a lab appointment only-must be scheduled by Wednesday the June 16th.

## 2019-07-23 NOTE — Addendum Note (Signed)
Addended by: Felix Pacini A on: 07/23/2019 08:18 AM   Modules accepted: Orders

## 2019-07-23 NOTE — Addendum Note (Signed)
Addended by: Felix Pacini A on: 07/23/2019 11:29 AM   Modules accepted: Orders

## 2019-07-23 NOTE — Telephone Encounter (Signed)
Pt was called and detailed message was left letting pt know if she wanted test it would have to be done early next week by lab appt only. She was asked to call back to schedule if she would like the testing.

## 2019-07-29 ENCOUNTER — Ambulatory Visit: Payer: BC Managed Care – PPO | Admitting: Family Medicine

## 2019-07-29 ENCOUNTER — Other Ambulatory Visit: Payer: Self-pay

## 2019-07-29 ENCOUNTER — Ambulatory Visit (INDEPENDENT_AMBULATORY_CARE_PROVIDER_SITE_OTHER): Payer: BC Managed Care – PPO

## 2019-07-29 ENCOUNTER — Encounter: Payer: Self-pay | Admitting: Family Medicine

## 2019-07-29 VITALS — BP 100/70 | HR 75 | Ht 62.0 in | Wt 124.0 lb

## 2019-07-29 DIAGNOSIS — R5383 Other fatigue: Secondary | ICD-10-CM | POA: Diagnosis not present

## 2019-07-29 DIAGNOSIS — M25561 Pain in right knee: Secondary | ICD-10-CM | POA: Diagnosis not present

## 2019-07-29 DIAGNOSIS — M7041 Prepatellar bursitis, right knee: Secondary | ICD-10-CM | POA: Diagnosis not present

## 2019-07-29 NOTE — Progress Notes (Signed)
Subjective:    I'm seeing this patient as a consultation for:  Dr Claiborne Billings. Note will be routed back to referring provider/PCP.  CC: R knee pain  I, Debbe Odea, am serving as a scribe for Dr. Clementeen Graham.  HPI: Pt is a 34 y/o female presenting w/ c/o R knee pain since 07/15/19 when she noticed pain and swelling in her R knee after kneeling on her knee on the wood floor and jumped up real fast, patient states that knee immediatly was swollen and felt like there was a bad bruise. The next day she had a bruise and that went away yesterday.  She was seen by her PCP on 07/20/19 and had a R knee XR and was advised to con't w/ OTC NSAIDs.  She locates her knee pain to her patella and describes pain as aching sore and tight.  Radiating pain: up her lateral R leg R knee swelling: yes  R knee mechanical symptoms: no  Aggravating factors: knee bending 90 degrees or bending back Treatments tried: aleve, tried staying off her knee and resting   Diagnostic testing: R knee XR- 07/20/19  Additionally she notes tha she is from [place].  She is worried that she may have Lyme disease.  With the knee swelling and her history of fatigue she would like to have testing.  Her PCP ordered a Lyme test on the 10th but she has 4 small children at home and can get away to the lab.  She wonders if she can get that lab done today.   Past medical history, Surgical history, Family history, Social history, Allergies, and medications have been entered into the medical record, reviewed.   Review of Systems: No new headache, visual changes, nausea, vomiting, diarrhea, constipation, dizziness, abdominal pain, skin rash, fevers, chills, night sweats, weight loss, swollen lymph nodes, body aches, joint swelling, muscle aches, chest pain, shortness of breath, mood changes, visual or auditory hallucinations.   Objective:    Vitals:   07/29/19 0818  BP: 100/70  Pulse: 75  SpO2: 99%   General: Well Developed, well nourished, and  in no acute distress.  Neuro/Psych: Alert and oriented x3, extra-ocular muscles intact, able to move all 4 extremities, sensation grossly intact. Skin: Warm and dry, no rashes noted.  Respiratory: Not using accessory muscles, speaking in full sentences, trachea midline.  Cardiovascular: Pulses palpable, no extremity edema. Abdomen: Does not appear distended. MSK: Right knee some remaining bruising and swelling overlying patella otherwise normal-appearing Range of motion 0-120 degrees without crepitation. Tender palpation overlying patella and patellar tendon Palpable squeak overlying patella.  Intact strength. Stable ligamentous exam. Negative McMurray's test.  Lab and Radiology Results DG Knee Complete 4 Views Right  Result Date: 07/21/2019 CLINICAL DATA:  Medial knee pain following healing several days ago, initial encounter EXAM: RIGHT KNEE - COMPLETE 4+ VIEW COMPARISON:  None. FINDINGS: No evidence of fracture, dislocation, or joint effusion. No evidence of arthropathy or other focal bone abnormality. Soft tissues are unremarkable. IMPRESSION: No acute abnormality noted. Electronically Signed   By: Alcide Clever M.D.   On: 07/21/2019 09:25   I, Clementeen Graham, personally (independently) visualized and performed the interpretation of the images attached in this note.  Diagnostic Limited MSK Ultrasound of: Right knee Quad tendon normal-appearing No significant joint effusion at superior patellar space. Patellar tendon is normal. Hypoechoic fluid tracking superficial to patellar tendon and patella consistent with mild prepatellar bursitis. Medial and lateral joint lines are normal-appearing Posterior knee shows no  Baker's cyst. Impression: Prepatellar bursitis   Impression and Recommendations:    Assessment and Plan: 34 y.o. female with right knee prepatellar bursitis due to trauma.  Plan for diclofenac gel and compressive sleeve when able to tolerate it.  Exercise program reviewed as  well.  Recheck back if not improving.  Possible Lyme disease: With knee swelling and fatigue reasonable to work-up especially given her history of living in an area where Lyme is more endemic in.  Plan for burgdorferi antibody assessment.  PDMP not reviewed this encounter. Orders Placed This Encounter  Procedures  . [place] LIMITED JOINT SPACE STRUCTURES LOW RIGHT    Standing Status:   Future    Number of Occurrences:   1    Standing Expiration Date:   07/28/2020    Order Specific Question:   Reason for Exam (SYMPTOM  OR DIAGNOSIS REQUIRED)    Answer:   Right knee pain    Order Specific Question:   Preferred imaging location?    Answer:   [facility]  . B. burgdorfi antibodies    Standing Status:   Future    Standing Expiration Date:   07/28/2020   No orders of the defined types were placed in this encounter.   Discussed warning signs or symptoms. Please see discharge instructions. Patient expresses understanding.   The above documentation has been reviewed and is accurate and complete Lynne Leader, M.D.

## 2019-07-29 NOTE — Patient Instructions (Signed)
Thank you for coming in today. I think this is prepatellar bursitis due to trauma. Use voltaren gel up to 4x daily for pain and swelling.  Use the knee sleeve if you can.  Will also test for Lyme disease.  Recheck with me if not better.    Prepatellar Bursitis Rehab Ask your health care provider which exercises are safe for you. Do exercises exactly as told by your health care provider and adjust them as directed. It is normal to feel mild stretching, pulling, tightness, or discomfort as you do these exercises. Stop right away if you feel sudden pain or your pain gets worse. Do not begin these exercises until told by your health care provider. Stretching and range-of-motion exercises These exercises warm up your muscles and joints and improve the movement and flexibility of your knee. These exercises also help to relieve pain, numbness, and tingling. Hamstring stretch, standing This is an exercise in which you prop your leg on a chair and lean forward to achieve a stretch. To do this exercise: 1. Stand with your left / right heel resting on a chair. Your left / right leg should be fully extended. 2. Arch your lower back slightly. 3. Lean forward at the waist, leading with your chest, until you feel a gentle stretch in the back of your left / right knee or thigh. You should not need to lean far to feel the stretch. 4. Hold this position for __________ seconds. Repeat __________ times. Complete this exercise __________ times a day. Knee flexion, active  1. Lie on your back with both knees straight. If this causes back discomfort, bend your healthy knee so your foot is flat on the floor. 2. Slowly slide your left / right heel back toward your buttocks (knee flexion). Stop when you feel a gentle stretch in the front of your knee or thigh. 3. Hold this position for __________ seconds. 4. Slowly slide your left / right heel back to the starting position. Repeat __________ times. Complete this  exercise __________ times a day. Strengthening exercises These exercises build strength and endurance in your knee. Endurance is the ability to use your muscles for a long time, even after they get tired. Quadriceps, isometric This exercise stretches the muscles in the front of your thigh (quadriceps) without moving your knee joint (isometric). 1. Lie on your back with your left / right leg extended and your other knee bent. If told by your health care provider, put a rolled towel or a small pillow under your left / right knee. 2. Slowly tense the muscles in the front of your left / right thigh. You should see your kneecap slide up toward your hip or see increased dimpling just above your knee. This motion will push the back of your knee down toward the surface that is under it. 3. For __________ seconds, hold the muscles as tight as you can without increasing your pain. 4. Relax the muscles slowly and completely after each repetition. Repeat __________ times. Complete this exercise __________ times a day. Straight leg raises, supine This exercise is sometimes called a quadriceps and hip flexor exercise. 1. Lie on your back (supine position) with your left / right leg extended and your healthy knee bent. 2. Slowly tense the muscles in the front of your left / right thigh (quadriceps). You should see your kneecap slide up or see increased dimpling just above the knee. 3. Tighten these muscles even more as you raise your leg 4-6 inches (10-15  cm) off the floor. Do not let your knee bend. 4. Hold this position for __________ seconds. 5. Keep the thigh muscles tense as you lower your leg. 6. Relax your muscles slowly and completely after each repetition. Repeat __________ times. Complete this exercise __________ times a day. Straight leg raises, prone This is an exercise in which you stretch the muscles in the front and back of your thigh (hip extensors). 1. Lie on your abdomen (prone position) on a  firm surface. You can put a pillow under your hips if that is more comfortable for your lower back. 2. Squeeze your buttocks muscles and lift your left / right leg about 4-6 inches (10-15 cm). Keep your knee straight as you lift your leg. 3. Hold this position for __________ seconds. 4. Slowly lower your leg to the starting position. 5. Let your muscles relax completely after each repetition. Repeat __________ times. Complete this exercise __________ times a day. This information is not intended to replace advice given to you by your health care provider. Make sure you discuss any questions you have with your health care provider. Document Revised: 05/26/2018 Document Reviewed: 05/26/2018 Elsevier Patient Education  [employer].

## 2019-07-29 NOTE — Addendum Note (Signed)
Addended by: Miguel Aschoff on: 07/29/2019 08:49 AM   Modules accepted: Orders

## 2019-07-30 LAB — B. BURGDORFI ANTIBODIES: B burgdorferi Ab IgG+IgM: <0.9 index

## 2019-07-30 NOTE — Progress Notes (Signed)
Lyme disease test is negative indicating no current or past infection with Lyme disease thankfully.

## 2019-07-31 ENCOUNTER — Ambulatory Visit: Payer: BC Managed Care – PPO

## 2019-07-31 ENCOUNTER — Telehealth: Payer: Self-pay

## 2019-07-31 NOTE — Telephone Encounter (Signed)
[facility] Night - Client Nonclinical Telephone Record  [facility] Client [facility] Night - Client Client Site [facility] Night Physician Claiborne Billings, [place] Contact Type Call Who Is Calling Patient / Member / Family / Caregiver Caller Name Charlotta Lapaglia Caller Phone Number [phone] Patient Name Sarah Mullins Patient DOB 1985-12-24 Call Type Message Only Information Provided  Reason for Call Request to Cancel Office Appointment Initial Comment Caller states she would like to cancel her appt.  Additional Comment Declined to speak to the nurse. Disp. Time Disposition Final User 07/30/2019 5:15:07 PM General Information Provided Yes Chyrel Masson Call Closed By: Chyrel Masson Transaction Date/Time: 07/30/2019 5:13:17 PM (ET)  Lab appt cancelled for patient

## 2019-10-06 ENCOUNTER — Ambulatory Visit
Admission: RE | Admit: 2019-10-06 | Discharge: 2019-10-06 | Disposition: A | Discharge status: Home / Self Care | Payer: BC Managed Care – PPO | Source: Ambulatory Visit | Attending: Internal Medicine | Admitting: Internal Medicine

## 2019-10-06 DIAGNOSIS — E041 Nontoxic single thyroid nodule: Secondary | ICD-10-CM | POA: Diagnosis not present

## 2019-10-14 ENCOUNTER — Encounter: Payer: Self-pay | Admitting: Internal Medicine

## 2019-10-14 ENCOUNTER — Other Ambulatory Visit: Payer: Self-pay

## 2019-10-14 ENCOUNTER — Telehealth (INDEPENDENT_AMBULATORY_CARE_PROVIDER_SITE_OTHER): Payer: BC Managed Care – PPO | Admitting: Internal Medicine

## 2019-10-14 DIAGNOSIS — E042 Nontoxic multinodular goiter: Secondary | ICD-10-CM | POA: Diagnosis not present

## 2019-10-14 NOTE — Progress Notes (Signed)
Virtual Visit via Video Note  I connected with Sarah Mullins on 10/14/19  at  1.00 pm by a video enabled telemedicine application and verified that I am speaking with the correct person using two identifiers.   I discussed the limitations of evaluation and management by telemedicine and the availability of in person appointments. The patient expressed understanding and agreed to proceed.  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 This prompted a thyroid ultrasound which showed a right inferior cysts 3.8 cm meeting criteria for FNA  She is S/P FNA in 09/2018 with scant cellularity , this was attributed to the cystic nature of the nodule.   No FH of thyroid disease   SUBJECTIVE:    Today (10/14/2019):  Ms. [name] is here for a follow up on MNG . She denies any local neck symptoms  She denies any hyper or hypothyroid symptoms  She has chronic constipation        ROS:  As per HPI.   HISTORY:  Past Medical History:  Past Medical History:  Diagnosis Date  . Chicken pox   . Chronic constipation   . Instability of joint    right knee  . Raynaud's phenomenon    mild    Past Surgical History:  Past Surgical History:  Procedure Laterality Date  . APPENDECTOMY  2002  . WISDOM TOOTH EXTRACTION        Social History:  reports that she has never smoked. She has never used smokeless tobacco. She reports current alcohol use. She reports that she does not use drugs. Family History:  Family History  Problem Relation Age of Onset  . Uterine cancer Maternal Grandmother   . Dementia Paternal Grandfather        senile dementia or possible stroke  . Cervical cancer Mother   . Heart disease Maternal Grandfather   . Hearing loss Paternal Grandmother   . Hypertension Paternal Grandmother       HOME MEDICATIONS: Allergies as of 10/14/2019      Reactions   Beta Adrenergic Blockers    REACTION: circulation problems      Medication List       Accurate as of October 14, 2019  9:23 AM. If you have any questions, ask your nurse or doctor.        b complex vitamins tablet Take 1 tablet by mouth daily.   meloxicam 15 MG tablet Commonly known as: MOBIC Take 1 tablet (15 mg total) by mouth daily.   multivitamin capsule Take 1 capsule by mouth daily.   naproxen sodium 220 MG tablet Commonly known as: ALEVE Take 220 mg by mouth daily as needed.        DATA REVIEWED:  Results for Sarah Mullins  Elbert Ewings (MRN [id number]) as of 10/14/2019 12:53  Ref. Range 10/01/2018 13:48  TSH Latest Ref Range: 0.35 - 4.50 uIU/mL 0.68  Triiodothyronine,Free,Serum Latest Ref Range: 2.3 - 4.2 pg/mL 3.3      Thyroid Ultrasound 10/06/2019   Estimated total number of nodules >/= 1 cm: 2  Number of spongiform nodules >/=  2 cm not described below (TR1): 0  Number of mixed cystic and solid nodules >/= 1.5 cm not described below (TR2): 0  _________________________________________________________  Nodule # 1:  Location: Right; Superior  Maximum size: 1.4, previously 1.3 cm; Other 2 dimensions: 0.9 x 0.8 cm  Composition: mixed cystic and solid (1)  Echogenicity: hypoechoic (2)  Shape: not taller-than-wide (0)  Margins: ill-defined (0)  Echogenic foci: none (0)  ACR  TI-RADS total points: 3.  ACR TI-RADS risk category: TR3 (3 points).  ACR TI-RADS recommendations:  Given size (<1.4 cm) and appearance, this nodule does NOT meet TI-RADS criteria for biopsy or dedicated follow-up.  This nodule has been down graded because there is further cystic degeneration from a TR 4 nodule to a TR 3 nodule.  _________________________________________________________  Nodule # 2:  Location: Right; Inferior  Maximum size: 2.8, previously 3.8 cm; Other 2 dimensions: 2.0 x 1.2 cm  Composition: mixed cystic and solid (1)  Echogenicity: hypoechoic (2)  Shape: not taller-than-wide (0)  Margins: ill-defined (0)  Echogenic foci: none (0)  ACR TI-RADS total points: 3.  ACR TI-RADS risk category: TR3 (3 points).  ACR TI-RADS recommendations:  **Given size (>/= 2.5 cm) and appearance, fine needle aspiration of this mildly suspicious nodule should be considered based on TI-RADS criteria.  This nodule has been previously aspirated. Correlate with prior pathology.  _________________________________________________________  No new or enlarging nodule. No significant left thyroid abnormality.  No hypervascularity or regional adenopathy.  IMPRESSION: 1.4 cm right superior TR 3 nodule, down graded from a previous TR 4 nodule. This nodule no longer meets criteria for biopsy or follow-up.  2.8 cm right inferior TR 3 nodule, previously biopsied. Correlate with pathology.  No new or enlarging nodule.  ASSESSMENT / PLAN / RECOMMENDATIONS:   1. MNG:  - Pt is clinically euthyroid  - TFT's from last year normal - No local neck symptoms  - She is S/P FNA of the right inferior nodule in 09/2018 with scan cellularity ( [facility]hesda category I) , this was though due to the cystic nature of the nodule at the time. Repeat ultrasound in 09/2019 showed decreased in size by 1 cm but this was attributed to decrease in fluid size, but there's a  larger solid component in images this year and pt in agreement to proceed with another FNA.    F/U in 1 yr    Signed electronically by: Lyndle Herrlich, MD  [facility] End[facility]  [facility] [address] [place] [place], [place] [address] Phone: [phone] FAX: [phone]      CC: Darcella Gasman [address] [place] [address] Phone: [phone]  Fax: [phone]   Return to Endocrinology clinic as below: Future Appointments  Date Time Provider Department Center  10/14/2019  1:00 PM Carlyle Achenbach, Konrad Dolores, MD [id number]

## 2019-10-20 ENCOUNTER — Encounter: Payer: Self-pay | Admitting: Family Medicine

## 2019-10-20 ENCOUNTER — Other Ambulatory Visit: Payer: Self-pay

## 2019-10-20 ENCOUNTER — Ambulatory Visit (INDEPENDENT_AMBULATORY_CARE_PROVIDER_SITE_OTHER): Payer: BC Managed Care – PPO | Admitting: Family Medicine

## 2019-10-20 VITALS — BP 105/71 | HR 95 | Temp 97.4°F | Resp 16 | Wt 124.2 lb

## 2019-10-20 DIAGNOSIS — S060X0A Concussion without loss of consciousness, initial encounter: Secondary | ICD-10-CM

## 2019-10-20 NOTE — Progress Notes (Signed)
OFFICE VISIT  10/20/2019  CC:  Chief Complaint  Patient presents with  . Headache  . Fatigue  . Fall    HPI:    Patient is a 34 y.o. Caucasian female who presents for headache. Five days ago slipped when walking near a creek/waterfall and hit her head on a rock.  Head hurt on back right area right away but no LOC, no dizziness, no nausea.  No cut or large hematoma. She felt fine to get up and continue her walking with her family as they had been doing prior to her fall. Next day began to feel a bit worse HA and got very fatigued. Sleep poor that night b/c rolling on the area hurt.  Sleeping more but not restorative. Eating fine. No instability/balance problems.  Focus is impaired some.  No mood issues or personality changes.  No vision abnl or hearing abnl.  Sensitive to light and noise. Advil tried->some relief.  Any activity just makes her more tired. She did a common sense concussion protocol.  No confusion or memory issues.  Just very tired. No hx of prior closed head injury.  ROS: no fevers, no CP, no SOB, no wheezing, no cough, no rashes, no melena/hematochezia.  No polyuria or polydipsia.  No myalgias or arthralgias.  No focal weakness, paresthesias, or tremors.  No acute vision or hearing abnormalities. No n/v/d or abd pain.  No palpitations.      Past Medical History:  Diagnosis Date  . Chicken pox   . Chronic constipation   . Instability of joint    right knee  . Multinodular goiter 2020   euthyroid, bx benign  . Raynaud's phenomenon    mild    Past Surgical History:  Procedure Laterality Date  . APPENDECTOMY  2002  . WISDOM TOOTH EXTRACTION      Outpatient Medications Prior to Visit  Medication Sig Dispense Refill  . b complex vitamins tablet Take 1 tablet by mouth daily.    . Multiple Vitamin (MULTIVITAMIN) capsule Take 1 capsule by mouth daily.     No facility-administered medications prior to visit.   SH: married, 4 children. Educ: masters deg No  T/A/ds  Allergies  Allergen Reactions  . Beta Adrenergic Blockers     REACTION: circulation problems    ROS As per HPI  PE: Vitals with BMI 10/20/2019 07/29/2019 07/20/2019  Height - 5\' 2"  5\' 2"   Weight 124 lbs 3 oz 124 lbs 127 lbs 4 oz  BMI - 22.67 23.27  Systolic 105 100  Diastolic 71 70 69  Pulse 95 75 64  O2 sat on RA today is 100%   Gen: Alert, well appearing.  Patient is oriented to person, place, time, and situation. AFFECT: pleasant, lucid thought and speech. Head: no hematoma or bruising or nodularity.  Minimal TTP over R posterior parietal area. ENT: Ears: EACs clear, normal epithelium.  TMs with good light reflex and landmarks bilaterally.  Eyes: no injection, icteris, swelling, or exudate.  EOMI, PERRLA. Nose: no drainage or turbinate edema/swelling.  No injection or focal lesion.  Mouth: lips without lesion/swelling.  Oral mucosa pink and moist.  Dentition intact and without obvious caries or gingival swelling.  Oropharynx without erythema, exudate, or swelling.  Neck - No masses or thyromegaly or limitation in range of motion CV: RRR, no m/r/g.   LUNGS: CTA bilat, nonlabored resps, good aeration in all lung fields. EXT: no clubbing or cyanosis.  no edema.  Neuro: CN 2-12 intact bilaterally,  strength 5/5 in proximal and distal upper extremities and lower extremities bilaterally.  No sensory deficits.  No tremor.  No disdiadochokinesis.  No ataxia.  Upper extremity and lower extremity DTRs symmetric.  No pronator drift. Tandem walking normal. Balances on one/either foot w/out prob. Eye tracking normal.  No nystagmus.   LABS:    Chemistry      Component Value Date/Time   NA 138 10/18/2017 1014   K 4.3 10/18/2017 1014   CL 105 10/18/2017 1014   CO2 27 10/18/2017 1014   BUN 13 10/18/2017 1014   CREATININE 0.68 10/18/2017 1014      Component Value Date/Time   CALCIUM 9.3 10/18/2017 1014   ALKPHOS 58 10/18/2017 1014   AST 13 10/18/2017 1014   ALT 12 10/18/2017  1014   BILITOT 0.5 10/18/2017 1014     Lab Results  Component Value Date   WBC 5.2 10/18/2017   HGB 13.5 10/18/2017   HCT 40.8 10/18/2017   MCV 92.6 10/18/2017   PLT 219.0 10/18/2017   Lab Results  Component Value Date   TSH 0.68 10/01/2018     IMPRESSION AND PLAN:  Concussion.  No features worrisome for intracranial bleeding or structural injury. She is gradually improving but still her fatigue is significantly impairing. Recommended minimizing brain stimulation (no screens/TV/reading/teaching).  Minimize conversation.  No strenuous physical activity. If not continuing to improve in 1 wk then call or return--I'll likely have her see a sports med MD at that time for further eval. Signs/symptoms to call or return for were reviewed and pt expressed understanding.  An After Visit Summary was printed and given to the patient.  FOLLOW UP: No follow-ups on file.  Signed:  Santiago Bumpers, MD           10/20/2019

## 2019-10-27 ENCOUNTER — Ambulatory Visit
Admission: RE | Admit: 2019-10-27 | Discharge: 2019-10-27 | Disposition: A | Discharge status: Home / Self Care | Payer: BC Managed Care – PPO | Source: Ambulatory Visit | Attending: Internal Medicine | Admitting: Internal Medicine

## 2019-10-27 ENCOUNTER — Other Ambulatory Visit (HOSPITAL_COMMUNITY)
Admission: RE | Admit: 2019-10-27 | Discharge: 2019-10-27 | Disposition: A | Discharge status: Home / Self Care | Payer: BC Managed Care – PPO | Source: Ambulatory Visit | Attending: Radiology | Admitting: Radiology

## 2019-10-27 DIAGNOSIS — E041 Nontoxic single thyroid nodule: Secondary | ICD-10-CM | POA: Diagnosis not present

## 2019-10-27 DIAGNOSIS — E042 Nontoxic multinodular goiter: Secondary | ICD-10-CM

## 2019-10-29 ENCOUNTER — Telehealth: Payer: Self-pay | Admitting: Internal Medicine

## 2019-10-29 LAB — CYTOLOGY - NON PAP

## 2019-10-29 NOTE — Telephone Encounter (Signed)
Patient requests to be called at ph# [phone] for her thyroid biopsy results (Results on MyChart-patient requests to be called to go over)

## 2019-10-30 NOTE — Telephone Encounter (Signed)
Dr. Harvel Ricks MyChart message was reiterated to pt again via MyChart.

## 2019-11-08 ENCOUNTER — Encounter: Payer: Self-pay | Admitting: Internal Medicine

## 2019-11-08 ENCOUNTER — Encounter: Payer: Self-pay | Admitting: Family Medicine

## 2019-11-08 DIAGNOSIS — F0781 Postconcussional syndrome: Secondary | ICD-10-CM

## 2019-11-08 DIAGNOSIS — S060X0D Concussion without loss of consciousness, subsequent encounter: Secondary | ICD-10-CM

## 2019-11-09 NOTE — Telephone Encounter (Signed)
[place] recommendations:  If not continuing to improve in 1 wk then call or return--I'll likely have her see a sports med MD at that time for further eval.  Please read below and advise.

## 2019-11-13 NOTE — Telephone Encounter (Signed)
I ordered referral to sports med just now. Does not need to see me unless she really wants to.

## 2019-11-13 NOTE — Telephone Encounter (Signed)
Spoke with pt regarding referral and appt. Pt would like to cancel appt

## 2019-11-13 NOTE — Addendum Note (Signed)
Addended by: Jeoffrey Massed on: 11/13/2019 04:31 PM   Modules accepted: Orders

## 2019-11-13 NOTE — Telephone Encounter (Signed)
Spoke with pt and sched with next available on 10/07 to see Dr. Milinda Cave. Pt would like to know if there is any way a referral can be placed prior to appt. Pt was last seen by same provider on 09/07 for following concern.   Please advise if referral can be place?

## 2019-11-13 NOTE — Telephone Encounter (Signed)
Patient following up from message she sent earlier in the week.  She has not heard back from anyone yet.  Please advise. Patient can be reached at [phone].  Thank you

## 2019-11-13 NOTE — Telephone Encounter (Signed)
I would advise her of Dr. Milinda Cave recs from her appt and get her back in if she is still having symptoms.   Please apologize to her- I thought she was informed of Dr. Verdis Frederickson recs. I misread the message.

## 2019-11-16 ENCOUNTER — Telehealth: Payer: Self-pay | Admitting: Family Medicine

## 2019-11-16 NOTE — Telephone Encounter (Signed)
Called and LM for pt to return call to schedule a new pt concussion appt.  Please schedule in any appropriate 30 min slot between 8-3:30 pm.

## 2019-11-16 NOTE — Telephone Encounter (Signed)
Scheduled for 10-6

## 2019-11-16 NOTE — Telephone Encounter (Signed)
See referral from Dr. Marvel Plan regarding concussion, request for appt.

## 2019-11-18 ENCOUNTER — Encounter: Payer: Self-pay | Admitting: Family Medicine

## 2019-11-18 ENCOUNTER — Ambulatory Visit: Payer: BC Managed Care – PPO | Admitting: Family Medicine

## 2019-11-18 ENCOUNTER — Other Ambulatory Visit: Payer: Self-pay

## 2019-11-18 VITALS — BP 94/68 | HR 72 | Ht 62.0 in | Wt 124.8 lb

## 2019-11-18 DIAGNOSIS — S060X0A Concussion without loss of consciousness, initial encounter: Secondary | ICD-10-CM

## 2019-11-18 DIAGNOSIS — M542 Cervicalgia: Secondary | ICD-10-CM

## 2019-11-18 DIAGNOSIS — H538 Other visual disturbances: Secondary | ICD-10-CM

## 2019-11-18 DIAGNOSIS — M5481 Occipital neuralgia: Secondary | ICD-10-CM | POA: Diagnosis not present

## 2019-11-18 DIAGNOSIS — E041 Nontoxic single thyroid nodule: Secondary | ICD-10-CM

## 2019-11-18 DIAGNOSIS — E042 Nontoxic multinodular goiter: Secondary | ICD-10-CM

## 2019-11-18 LAB — COMPREHENSIVE METABOLIC PANEL
ALT: 17 U/L (ref 0–35)
AST: 17 U/L (ref 0–37)
Albumin: 4.3 g/dL (ref 3.5–5.2)
Alkaline Phosphatase: 48 U/L (ref 39–117)
BUN: 13 mg/dL (ref 6–23)
CO2: 28 mEq/L (ref 19–32)
Calcium: 9.2 mg/dL (ref 8.4–10.5)
Chloride: 101 mEq/L (ref 96–112)
Creatinine, Ser: 0.64 mg/dL (ref 0.40–1.20)
GFR: 114.97 mL/min (ref >60.00)
Glucose, Bld: 87 mg/dL (ref 70–99)
Potassium: 4.2 mEq/L (ref 3.5–5.1)
Sodium: 135 mEq/L (ref 135–145)
Total Bilirubin: 0.3 mg/dL (ref 0.2–1.2)
Total Protein: 7.1 g/dL (ref 6.0–8.3)

## 2019-11-18 LAB — CBC WITH DIFFERENTIAL/PLATELET
Basophils Absolute: 0 10*3/uL (ref 0.0–0.1)
Basophils Relative: 0.7 % (ref 0.0–3.0)
Eosinophils Absolute: 0.1 10*3/uL (ref 0.0–0.7)
Eosinophils Relative: 2.3 % (ref 0.0–5.0)
HCT: 40.1 % (ref 36.0–46.0)
Hemoglobin: 13.4 g/dL (ref 12.0–15.0)
Lymphocytes Relative: 31.2 % (ref 12.0–46.0)
Lymphs Abs: 1.9 10*3/uL (ref 0.7–4.0)
MCHC: 33.3 g/dL (ref 30.0–36.0)
MCV: 92.9 fl (ref 78.0–100.0)
Monocytes Absolute: 0.5 10*3/uL (ref 0.1–1.0)
Monocytes Relative: 8.1 % (ref 3.0–12.0)
Neutro Abs: 3.5 10*3/uL (ref 1.4–7.7)
Neutrophils Relative %: 57.7 % (ref 43.0–77.0)
Platelets: 257 10*3/uL (ref 150.0–400.0)
RBC: 4.31 Mil/uL (ref 3.87–5.11)
RDW: 12.3 % (ref 11.5–15.5)
WBC: 6.1 10*3/uL (ref 4.0–10.5)

## 2019-11-18 LAB — TSH: TSH: 0.88 u[IU]/mL (ref 0.35–4.50)

## 2019-11-18 MED ORDER — TOPIRAMATE 25 MG PO TABS: 25.0000 mg | ORAL_TABLET | Freq: Two times a day (BID) | ORAL | 1 refills | Status: DC

## 2019-11-18 NOTE — Progress Notes (Signed)
Subjective:    Chief Complaint: Sarah Mullins,  is a 34 y.o. female who presents for evaluation of a head injury that occurred on 10/15/19 when she slipped and fell and hit her head on rock while hiking.  She was initially seen by her PCP on 10/20/19 w/ c/o HA, fatigue, difficulty sleeping.  Since then, pt reports having a fairly constant HA and has been taking Tylenol almost daily since.  She stopped taking Tylenol a few days ago and is now feeling fatigued and exhausted again.  She feels like she has been regressing over the past 1-2 weeks.  She is now experiencing intermittent, sharp head pains.  Injury date : 10/15/19 Visit #: 1   History of Present Illness:    Concussion Self-Reported Symptom Score Symptoms rated on a scale 1-6, in last 24 hours   Headache: 4    Nausea: 1  Dizziness: 0  Vomiting: 0  Balance Difficulty: 0   Trouble Falling Asleep: 1   Fatigue: 5  Sleep Less Than Usual: 0  Daytime Drowsiness: 5  Sleep More Than Usual: 4  Photophobia: 2  Phonophobia: 3  Irritability: 2  Sadness: 0  Numbness or Tingling: 0  Nervousness: 0  Feeling More Emotional: 1  Feeling Mentally Foggy: 2  Feeling Slowed Down: 3  Memory Problems: 0  Difficulty Concentrating: 3  Visual Problems: 0   Total # of Symptoms: 13/22 Total Symptom Score: 36-132 Previous Symptom Score: N/A   Neck Pain: Yes  Tinnitus: No  Review of Systems: No fevers or chills   Review of History: Goiter recently last year and again this year.  Last TSH over a year ago. No prior history significant migraines ADHD or anxiety or depression.  Objective:    Physical Examination Vitals:   11/18/19 1423  BP: 94/68  Pulse: 72  SpO2: 100%   MSK: C-spine normal-appearing nontender midline.  Tender palpation right cervical paraspinal musculature at the base of the skull.  Normal cervical motion. HEENT: No thyroid nodule palpable today Neuro: Normal coordination balance and gait.  Symptomatic with VOMS  testing horizontal.  Normal vertical VOMS testing and normal horizontal saccades. Accommodation is abnormal measuring approximately 15 cm Psych: Normal speech thought process and affect.    Assessment and Plan   34 y.o. female with prolonged concussion.  Patient had concussion originally over a month ago.  She was improving then her symptoms worsened again.  Dominant issues are headache and significant ocular symptoms.  At this point she ought to be improving.  Plan for referral to neuro ophthalmology for evaluation and management of her ocular symptoms.  Additionally will refer to physical therapy for what I think is cervicogenic headaches if possible right-sided occipital neuralgia.  We will treat headache with trial of Topamax.  Recheck in 2 to 4 weeks.  We will continue to coordinate with patient in the meantime.  Discussed precautions and symptom management.  Additionally some of her symptoms could possibly be related to her thyroid dysfunction and thyroid nodules.  Is been sometime since her labs are tested.  We will check TSH metabolic panel and CBC today.  Brynne L Dutt presents with the following concussion subtypes. [] Cognitive [] Cervical [] Vestibular [x] Ocular [x] Migraine [] Anxiety/Mood       Action/Discussion: Reviewed diagnosis, management options, expected outcomes, and the reasons for scheduled and emergent follow-up. Questions were adequately answered. Patient expressed verbal understanding and agreement with the following plan.     Patient Education:  Reviewed with patient the risks (  i.e, a repeat concussion, post-concussion syndrome, second-impact syndrome) of returning to play prior to complete resolution, and thoroughly reviewed the signs and symptoms of concussion.Reviewed need for complete resolution of all symptoms, with rest AND exertion, prior to return to play.  Reviewed red flags for urgent medical evaluation: worsening symptoms, nausea/vomiting,  intractable headache, musculoskeletal changes, focal neurological deficits.  [facility] Concussion Care Plan, which clearly outlines the plans stated above, was given to patient.   In addition to the time spent performing tests, I spent 40 min   Reviewed with patient the risks (i.e, a repeat concussion, post-concussion syndrome, second-impact syndrome) of returning to play prior to complete resolution, and thoroughly reviewed the signs and symptoms of      concussion. Reviewedf need for complete resolution of all symptoms, with rest AND exertion, prior to return to play.  Reviewed red flags for urgent medical evaluation: worsening symptoms, nausea/vomiting, intractable headache, musculoskeletal changes, focal neurological deficits.  [facility] Concussion Care Plan, which clearly outlines the plans stated above, was given to patient   After Visit Summary printed out and provided to patient as appropriate.  The above documentation has been reviewed and is accurate and complete Clementeen Graham

## 2019-11-18 NOTE — Patient Instructions (Addendum)
Thank you for coming in today.  I've referred you to Physical Therapy.  Let us know if you don't hear from them in one week.  Please get labs today before you leave  You should also hear from [facility] eye center for Neuro Ophthalmology. Ok to call them yourself and schedule.  Same with [facility] PT in [place].  Recheck with me in 2-4 weeks.   Start low dose topamax 1x daily for 1 week then increase to 2x daily.  Let me know if you have a problem with it.    Concussion, Adult  A concussion is a brain injury from a hard, direct hit (trauma) to the head or body. This direct hit causes the brain to shake quickly back and forth inside the skull. This can damage brain cells and cause chemical changes in the brain. A concussion may also be known as a mild traumatic brain injury (TBI). Concussions are usually not life-threatening, but the effects of a concussion can be serious. If you have a concussion, you should be very careful to avoid having a second concussion. What are the causes? This condition is caused by:  A direct hit to your head, such as: ? Running into another player during a game. ? Being hit in a fight. ? Hitting your head on a hard surface.  Sudden movement of your body that causes your brain to move back and forth inside the skull, such as in a car crash. What are the signs or symptoms? The signs of a concussion can be hard to notice. Early on, they may be missed by you, family members, and health care providers. You may look fine on the outside but may act or feel differently. Symptoms are usually temporary and most often improve in 7-10 days. Some symptoms appear right away, but other symptoms may not show up for hours or days. If your symptoms last longer than normal, you may have post-concussion syndrome. Every head injury is different. Physical symptoms  Headaches. This can include a feeling of pressure in the head or migraine-like symptoms.  Tiredness  (fatigue).  Dizziness.  Problems with coordination or balance.  Vision or hearing problems.  Sensitivity to light or noise.  Nausea or vomiting.  Changes in eating or sleeping patterns.  Numbness or tingling.  Seizure. Mental and emotional symptoms  Memory problems.  Trouble concentrating, organizing, or making decisions.  Slowness in thinking, acting or reacting, speaking, or reading.  Irritability or mood changes.  Anxiety or depression. How is this diagnosed? This condition is diagnosed based on:  Your symptoms.  A description of your injury. You may also have tests, including:  Imaging tests, such as a CT scan or MRI.  Neuropsychological tests. These measure your thinking, understanding, learning, and remembering abilities. How is this treated? Treatment for this condition includes:  Stopping sports or activity if you are injured. If you hit your head or show signs of concussion: ? Do not return to sports or activities the same day. ? Get checked by a health care provider before you return to your activities.  Physical and mental rest and careful observation, usually at home. Gradually return to your normal activities.  Medicines to help with symptoms such as headaches, nausea, or difficulty sleeping. ? Avoid taking opioid pain medicine while recovering from a concussion.  Avoiding alcohol and drugs. These may slow your recovery and can put you at risk of further injury.  Referral to a concussion clinic or rehabilitation center. Recovery from  a concussion can take time. How fast you recover depends on many factors. Return to activities only when:  Your symptoms are completely gone.  Your health care provider says that it is safe. Follow these instructions at home: Activity  Limit activities that require a lot of thought or concentration, such as: ? Doing homework or job-related work. ? Watching TV. ? Working on the computer or phone. ? Playing  memory games and puzzles.  Rest. Rest helps your brain heal. Make sure you: ? Get plenty of sleep. Most adults should get 7-9 hours of sleep each night. ? Rest during the day. Take naps or rest breaks when you feel tired.  Avoid physical activity like exercise until your health care provider says it is safe. Stop any activity that worsens symptoms.  Do not do high-risk activities that could cause a second concussion, such as riding a bike or playing sports.  Ask your health care provider when you can return to your normal activities, such as school, work, athletics, and driving. Your ability to react may be slower after a brain injury. Never do these activities if you are dizzy. Your health care provider will likely give you a plan for gradually returning to activities. General instructions   Take over-the-counter and prescription medicines only as told by your health care provider. Some medicines, such as blood thinners (anticoagulants) and aspirin, may increase the risk for complications, such as bleeding.  Do not drink alcohol until your health care provider says you can.  Watch your symptoms and tell others around you to do the same. Complications sometimes occur after a concussion. Older adults with a brain injury may have a higher risk of serious complications.  Tell your work Production designer, theatre/television/film, teachers, Tax adviser, school counselor, coach, or Event organiser about your injury, symptoms, and restrictions.  Keep all follow-up visits as told by your health care provider. This is important. How is this prevented? Avoiding another brain injury is very important. In rare cases, another injury can lead to permanent brain damage, brain swelling, or death. The risk of this is greatest during the first 7-10 days after a head injury. Avoid injuries by:  Stopping activities that could lead to a second concussion, such as contact or recreational sports, until your health care provider says it is  okay.  Taking these actions once you have returned to sports or activities: ? Avoiding plays or moves that can cause you to crash into another person. This is how most concussions occur. ? Following the rules and being respectful of other players. Do not engage in violent or illegal plays.  Getting regular exercise that includes strength and balance training.  Wearing a properly fitting helmet during sports, biking, or other activities. Helmets can help protect you from serious skull and brain injuries, but they do not protect you from a concussion. Even when wearing a helmet, you should avoid being hit in the head. Contact a health care provider if:  Your symptoms get worse or they do not improve.  You have new symptoms.  You have another injury. Get help right away if:  You have severe or worsening headaches.  You have weakness or numbness in any part of your body.  You are confused.  Your coordination gets worse.  You vomit repeatedly.  You are sleepier than normal.  Your speech is slurred.  You cannot recognize people or places.  You have a seizure.  It is difficult to wake you up.  You have unusual  behavior changes.  You have changes in your vision.  You lose consciousness. Summary  A concussion is a brain injury that results from a hard, direct hit (trauma) to your head or body.  You may have imaging tests and neuropsychological tests to diagnose a concussion.  Treatment for this condition includes physical and mental rest and careful observation.  Ask your health care provider when you can return to your normal activities, such as school, work, athletics, and driving.  Get help right away if you have a severe headache, weakness on one side of the body, seizures, behavior changes, changes in vision, or if you are confused or sleepier than normal. This information is not intended to replace advice given to you by your health care provider. Make sure you  discuss any questions you have with your health care provider. Document Revised: 09/19/2017 Document Reviewed: 09/19/2017 Elsevier Patient Education  2020 [employer].

## 2019-11-19 ENCOUNTER — Encounter: Payer: Self-pay | Admitting: Family Medicine

## 2019-11-19 ENCOUNTER — Ambulatory Visit: Payer: BC Managed Care – PPO | Admitting: Family Medicine

## 2019-11-19 NOTE — Progress Notes (Signed)
Labs including thyroid labs are normal

## 2019-11-20 ENCOUNTER — Telehealth: Payer: Self-pay | Admitting: Family Medicine

## 2019-11-20 NOTE — Telephone Encounter (Signed)
Patient called stating that she contacted [facility] to schedule an appointment but they have not received her referral.  Can you resend this please?

## 2019-11-23 NOTE — Telephone Encounter (Signed)
This has been refaxed can you keep an eye out for if it goes through. Thank you

## 2019-11-26 DIAGNOSIS — H526 Other disorders of refraction: Secondary | ICD-10-CM | POA: Diagnosis not present

## 2019-11-26 DIAGNOSIS — F0781 Postconcussional syndrome: Secondary | ICD-10-CM | POA: Diagnosis not present

## 2019-11-26 DIAGNOSIS — G4459 Other complicated headache syndrome: Secondary | ICD-10-CM | POA: Diagnosis not present

## 2019-11-26 DIAGNOSIS — H538 Other visual disturbances: Secondary | ICD-10-CM | POA: Diagnosis not present

## 2019-12-09 ENCOUNTER — Ambulatory Visit: Payer: BC Managed Care – PPO | Admitting: Family Medicine

## 2019-12-09 ENCOUNTER — Encounter: Payer: Self-pay | Admitting: Family Medicine

## 2019-12-09 ENCOUNTER — Other Ambulatory Visit: Payer: Self-pay

## 2019-12-09 VITALS — BP 94/62 | HR 72 | Ht 62.0 in | Wt 124.8 lb

## 2019-12-09 DIAGNOSIS — S060X0D Concussion without loss of consciousness, subsequent encounter: Secondary | ICD-10-CM | POA: Diagnosis not present

## 2019-12-09 NOTE — Patient Instructions (Signed)
Thank you for coming in today.  Plan to continue home exercises.   This may take months to fully improve.  OK to restart exercise.   Get plenty of sleep.   More medicines  In the future if needed including Cymbalta (pain and mood) adderall (brain fog and focus) , and others.

## 2019-12-09 NOTE — Progress Notes (Signed)
Subjective:    Chief Complaint: Sarah Mullins,  is a 34 y.o. female who presents for f/u of a concussion she sustained on 10/15/19 when she slipped and fell while hiking and hit her head on a rock.  She was last seen by Dr. Denyse Amass on 11/18/19 and reported con't HA and fatigue/exhaustion.  She was prescribed Topamax but did not like it's side effects.  She was also referred to PT and Neuro Opthamology.  Since her last visit, pt reports that she's feeling a little better.  Her biggest issues remain photosensitivity, headaches that worsen w/ visual focusing.  She has been to PT and been to neuro opthamology.  She is able to read a bit more w/ less symptoms.  She con't to not feel like herself.    Injury date : 10/15/19 Visit #: 2   History of Present Illness:    Concussion Self-Reported Symptom Score Symptoms rated on a scale 1-6, in last 24 hours   Headache: 3    Nausea: 0  Dizziness: 0  Vomiting: 0  Balance Difficulty: 0   Trouble Falling Asleep: 0   Fatigue: 2  Sleep Less Than Usual: 0  Daytime Drowsiness: 3  Sleep More Than Usual: 1  Photophobia: 3  Phonophobia: 2  Irritability: 2  Sadness: 0  Numbness or Tingling: 0  Nervousness: 0  Feeling More Emotional: 1  Feeling Mentally Foggy: 1  Feeling Slowed Down: 2  Memory Problems: 1  Difficulty Concentrating: 3  Visual Problems: 0   Total # of Symptoms: 12/22 Total Symptom Score: 24/132 Previous Total # of Symptoms: 13/22 Previous Symptom Score: 36/132   Neck Pain: No  Tinnitus: No  Review of Systems: No fevers or chills  Review of History: Thyroid nodule  Objective:    Physical Examination Vitals:   12/09/19 1532  BP: 94/62  Pulse: 72  SpO2: 99%   MSK: C-spine normal motion Neuro: Normal coordination and gait Psych: Normal speech thought process and affect    Assessment and Plan   34 y.o. female with concussion.  Improving but still having some symptoms.  Plan to continue watchful waiting and home  exercise program for neck pain.  Intolerant of early medication trial.  Discussed potential treatment options in the future including bupropion or even Adderall for focus and memory type symptoms.  However expect that she will slowly improve.  Recheck back as needed.  Note for jury duty written today as well.     Action/Discussion: Reviewed diagnosis, management options, expected outcomes, and the reasons for scheduled and emergent follow-up. Questions were adequately answered. Patient expressed verbal understanding and agreement with the following plan.     Patient Education:  Reviewed with patient the risks (i.e, a repeat concussion, post-concussion syndrome, second-impact syndrome) of returning to play prior to complete resolution, and thoroughly reviewed the signs and symptoms of concussion.Reviewed need for complete resolution of all symptoms, with rest AND exertion, prior to return to play.  Reviewed red flags for urgent medical evaluation: worsening symptoms, nausea/vomiting, intractable headache, musculoskeletal changes, focal neurological deficits.  [facility] Concussion Care Plan, which clearly outlines the plans stated above, was given to patient.   In addition to the time spent performing tests, I spent 20 min   Reviewed with patient the risks (i.e, a repeat concussion, post-concussion syndrome, second-impact syndrome) of returning to play prior to complete resolution, and thoroughly reviewed the signs and symptoms of      concussion. Reviewedf need for complete resolution  of all symptoms, with rest AND exertion, prior to return to play.  Reviewed red flags for urgent medical evaluation: worsening symptoms, nausea/vomiting, intractable headache, musculoskeletal changes, focal neurological deficits.  [facility] Concussion Care Plan, which clearly outlines the plans stated above, was given to patient   After Visit Summary printed out and  provided to patient as appropriate.  The above documentation has been reviewed and is accurate and complete Clementeen Graham

## 2019-12-10 ENCOUNTER — Other Ambulatory Visit: Payer: Self-pay | Admitting: Family Medicine

## 2019-12-11 ENCOUNTER — Encounter: Payer: Self-pay | Admitting: Family Medicine

## 2019-12-14 NOTE — Telephone Encounter (Signed)
Refill denied. Per pt no longer taking med.

## 2020-08-22 ENCOUNTER — Telehealth: Payer: Self-pay | Admitting: Family Medicine

## 2020-08-22 ENCOUNTER — Ambulatory Visit (INDEPENDENT_AMBULATORY_CARE_PROVIDER_SITE_OTHER): Payer: BC Managed Care – PPO

## 2020-08-22 ENCOUNTER — Telehealth (INDEPENDENT_AMBULATORY_CARE_PROVIDER_SITE_OTHER): Payer: BC Managed Care – PPO | Admitting: Family Medicine

## 2020-08-22 ENCOUNTER — Other Ambulatory Visit: Payer: Self-pay

## 2020-08-22 ENCOUNTER — Encounter: Payer: Self-pay | Admitting: Family Medicine

## 2020-08-22 VITALS — HR 73

## 2020-08-22 DIAGNOSIS — R0602 Shortness of breath: Secondary | ICD-10-CM

## 2020-08-22 DIAGNOSIS — R509 Fever, unspecified: Secondary | ICD-10-CM | POA: Diagnosis not present

## 2020-08-22 DIAGNOSIS — R5383 Other fatigue: Secondary | ICD-10-CM | POA: Diagnosis not present

## 2020-08-22 DIAGNOSIS — R059 Cough, unspecified: Secondary | ICD-10-CM

## 2020-08-22 DIAGNOSIS — R079 Chest pain, unspecified: Secondary | ICD-10-CM | POA: Diagnosis not present

## 2020-08-22 DIAGNOSIS — Z3202 Encounter for pregnancy test, result negative: Secondary | ICD-10-CM | POA: Diagnosis not present

## 2020-08-22 DIAGNOSIS — Z20822 Contact with and (suspected) exposure to covid-19: Secondary | ICD-10-CM | POA: Diagnosis not present

## 2020-08-22 LAB — CBC WITH DIFFERENTIAL/PLATELET
Absolute Monocytes: 511 cells/uL (ref 200–950)
Basophils Absolute: 29 cells/uL (ref 0–200)
Basophils Relative: 0.4 %
Eosinophils Absolute: 108 cells/uL (ref 15–500)
Eosinophils Relative: 1.5 %
HCT: 42.7 % (ref 35.0–45.0)
Hemoglobin: 13.8 g/dL (ref 11.7–15.5)
Lymphs Abs: 1498 cells/uL (ref 850–3900)
MCH: 30.5 pg (ref 27.0–33.0)
MCHC: 32.3 g/dL (ref 32.0–36.0)
MCV: 94.5 fL (ref 80.0–100.0)
MPV: 10.9 fL (ref 7.5–12.5)
Monocytes Relative: 7.1 %
Neutro Abs: 5054 cells/uL (ref 1500–7800)
Neutrophils Relative %: 70.2 %
Platelets: 259 10*3/uL (ref 140–400)
RBC: 4.52 10*6/uL (ref 3.80–5.10)
RDW: 11.8 % (ref 11.0–15.0)
Total Lymphocyte: 20.8 %
WBC: 7.2 10*3/uL (ref 3.8–10.8)

## 2020-08-22 LAB — D-DIMER, QUANTITATIVE: D-Dimer, Quant: 0.9 mcg/mL FEU — ABNORMAL HIGH (ref <0.50)

## 2020-08-22 NOTE — Telephone Encounter (Signed)
Cxr and cbc normal. D- Dimer positive. >> pt called and urged to report to ED immediately for emergent imaging and anticoagulation if appropriate.

## 2020-08-22 NOTE — Addendum Note (Signed)
Addended by: Emi Holes D on: 08/22/2020 11:14 AM   Modules accepted: Orders

## 2020-08-22 NOTE — Progress Notes (Signed)
VIRTUAL VISIT VIA VIDEO  I connected with Sarah Mullins on 08/22/20 at 10:00 AM EDT by elemedicine application and verified that I am speaking with the correct person using two identifiers. Location patient: Home Location provider: [facility], Office Persons participating in the virtual visit: Patient, Dr. Claiborne Billings and Reece Agar. Sarah Mullins, CMA  I discussed the limitations of evaluation and management by telemedicine and the availability of in person appointments. The patient expressed understanding and agreed to proceed.   SUBJECTIVE Chief Complaint  Patient presents with   Cough    Pt c/o dry cough, chest tightness, SOB, post nasal drip x 3 day    HPI: [name] is a female present for shortness of breath.  Sx start: 3 days with cough- more of a tickle feeling, but id have one event of coughing fit with mild production.  Vaccine:declined.  Pt endorses chest tightness, low grade temp x1 days. PND, dry cough.  Pt denies chills, nausea, bowel changes or vomit. She denies calf tenderness or swelling.  He daughter was ill last week.  Otc: advil She reports a trip to [place] over the last week - she drove both directions straight through. There is not a blood clot history in the family. She states her OB had told her he was concerned for disorder with her h/o miscarriages- but she is uncertain the name.  She and her family had covid about 7 weeks ago.  She is not on BC.    ROS: See pertinent positives and negatives per HPI.  Patient Active Problem List   Diagnosis Date Noted   Multinodular goiter 10/14/2019   Thyroid nodule 10/06/2018    Social History   Tobacco Use   Smoking status: Never   Smokeless tobacco: Never  Substance Use Topics   Alcohol use: Yes    Current Outpatient Medications:    b complex vitamins tablet, Take 1 tablet by mouth daily., Disp: , Rfl:    Multiple Vitamin (MULTIVITAMIN) capsule, Take 1 capsule by mouth daily., Disp: , Rfl:   Allergies   Allergen Reactions   Beta Adrenergic Blockers     REACTION: circulation problems    OBJECTIVE: There were no vitals taken for this visit. Gen: No acute distress. Nontoxic in appearance. Seems mildly winded with speaking- able to complete full sentences.  HENT: AT. [place].  MMM.  Eyes:Pupils Equal Round Reactive to light, Extraocular movements intact,  Conjunctiva without redness, discharge or icterus. Chest: Cough not present. Winded.  Skin: no rashes, purpura or petechiae.  Neuro:  Alert. Oriented x3  Psych: Normal affect and demeanor. Normal speech. Normal thought content and judgment.  ASSESSMENT AND PLAN: [name] is a 35 y.o. female present for  Cough/Shortness of breath With her recent round trip to [place] and her appearing winded, I do have concerns for PE. It seems she may have a blood d/o with her h/o miscarriage- uncertain. She also had covid 7 weeks ago.  Her daughter was sick last week, so symptoms could be illness as well.  Ordered stat cxr, d-dimer and cbc.  Pt was urged to start ASA now.  - DG Chest 2 View; Future - D-Dimer, Quantitative; Future - CBC w/Diff; Future - she understands if symptoms worsen in any way- she is to report to ED immediately and not wait for outpt work up. Discussed signs of PE with her today. Pt states she understands.  - If positive d-dimer> she understands to report to ED IMMEDIATELY even if  symptoms are not worsening.    Felix Pacini, DO 08/22/2020   No follow-ups on file.  Orders Placed This Encounter  Procedures   DG Chest 2 View   D-Dimer, Quantitative   CBC w/Diff   No orders of the defined types were placed in this encounter.  Referral Orders  No referral(s) requested today

## 2020-08-22 NOTE — Patient Instructions (Signed)
Pulmonary Embolism A pulmonary embolism (PE) is a sudden blockage or decrease of blood flow in one or both lungs that happens when a clot travels into the arteries of the lung (pulmonary arteries). Most blockages come from a blood clot that forms in the vein of a leg or arm (deep vein thrombosis, DVT) and travels to the lungs. A clot is blood that has thickened into a gel or solid. PE is a dangerous and life-threatening condition that needs to be treated right away. What are the causes? This condition is usually caused by a blood clot that forms in a vein and moves to the lungs. In rare cases, it may be caused by air, fat, part of a tumor, or other tissue that moves through the veins and into the lungs. What increases the risk? The following factors may make you more likely to develop this condition: Experiencing a traumatic injury, such as breaking a hip or leg. Having: A spinal cord injury. Major surgery, especially hip or knee replacement, or surgery on parts of the nervous system or on the abdomen. A stroke. A blood-clotting disease. Long-term (chronic) lung or heart disease. Cancer, especially if you are being treated with chemotherapy. A central venous catheter. Taking medicines that contain estrogen. These include birth control pills and hormone replacement therapy. Being: Pregnant. In the period of time after your baby is delivered (postpartum). Older than age 60. Overweight. A smoker, especially if you have other risks. Not very active (sedentary), not being able to move at all, or spending long periods sitting, such as travel over 6 hours. You are also at a greater risk if you have a leg in a cast or splint. What are the signs or symptoms? Symptoms of this condition usually start suddenly and include: Shortness of breath during activity or at rest. Coughing, coughing up blood, or coughing up bloody mucus. Chest pain, back pain, or shoulder blade pain that gets worse with deep  breaths. Rapid or irregular heartbeat. Feeling light-headed or dizzy, or fainting. Feeling anxious. Pain and swelling in a leg. This is a symptom of DVT, which can lead to PE. How is this diagnosed? This condition may be diagnosed based on your medical history, a physical exam, and tests. Tests may include: Blood tests. An ECG (electrocardiogram) of the heart. A CT pulmonary angiogram. This test checks blood flow in and around your lungs. A ventilation-perfusion scan, also called a lung VQ scan. This test measures air flow and blood flow to the lungs. An ultrasound to check for a DVT. How is this treated? Treatment for this condition depends on many factors, such as the cause of your PE, your risk for bleeding or developing more clots, and other medical conditions you may have. Treatment aims to stop blood clots from forming or growing larger. In some cases, treatment may be aimed at breaking apart or removing the blood clot. Treatment may include: Medicines, such as: Blood thinning medicines, also called anticoagulants, to stop clots from forming and growing. Medicines that break apart clots (fibrinolytics). Procedures, such as: Using a flexible tube to remove a blood clot (embolectomy) or to deliver medicine to destroy it (catheter-directed thrombolysis). Surgery to remove the clot (surgical embolectomy). This is rare. You may need a combination of immediate, long-term, and extended treatments. Your treatment may continue for several months (maintenance therapy) or longer depending on your medical conditions. You and your health care provider will work together to choose the treatment program that is best for you. Follow   these instructions at home: Medicines Take over-the-counter and prescription medicines only as told by your health care provider. If you are taking blood thinners: Talk with your health care provider before you take any medicines that contain aspirin or NSAIDs, such as  ibuprofen. These medicines increase your risk for dangerous bleeding. Take your medicine exactly as told, at the same time every day. Avoid activities that could cause injury or bruising, and follow instructions about how to prevent falls. Wear a medical alert bracelet or carry a card that lists what medicines you take. Understand what foods and drugs interact with any medicines that you are taking. General instructions Ask your health care provider when you may return to your normal activities. Avoid sitting or lying for a long time without moving. Maintain a healthy weight. Ask your health care provider what weight is healthy for you. Do not use any products that contain nicotine or tobacco. These products include cigarettes, chewing tobacco, and vaping devices, such as e-cigarettes. If you need help quitting, ask your health care provider. Talk with your health care provider about any travel plans. It is important to make sure that you are still able to take your medicine while traveling. Keep all follow-up visits. This is important. Where to find more information American Lung Association: www.lung.org Centers for Disease Control and Prevention: www.cdc.gov Contact a health care provider if: You missed a dose of your blood thinner medicine. You have a fever. Get help right away if: You have: New or increased pain, swelling, warmth, or redness in an arm or leg. Shortness of breath that gets worse during activity or at rest. Worsening chest pain. A rapid or irregular heartbeat. A severe headache. Vision changes. A serious fall or accident, or you hit your head. Blood in your vomit, stool, or urine. A cut that will not stop bleeding. You cough up blood. You feel light-headed or dizzy, and that feeling does not go away. You cannot move your arms or legs. You are confused or have memory loss. These symptoms may represent a serious problem that is an emergency. Do not wait to see if the  symptoms will go away. Get medical help right away. Call your local emergency services (911 in the [place] Do not drive yourself to the hospital. Summary A pulmonary embolism (PE) is a serious and potentially life-threatening condition. It happens when a blood clot from one part of the body travels to the arteries of the lung, causing a sudden blockage or decrease of blood flow to the lungs. This may result in shortness of breath, chest pain, dizziness, and fainting. Treatments for this condition usually include medicines to thin your blood (anticoagulants) or medicines to break apart blood clots. If you are given blood thinners, take your medicine exactly as told by your health care provider, at the same time every day. This is important. Understand what foods and drugs interact with any medicines that you are taking. If you have signs of PE or DVT, call your local emergency services (911 in the [place] This information is not intended to replace advice given to you by your health care provider. Make sure you discuss any questions you have with your health care provider. Document Revised: 01/01/2020 Document Reviewed: 01/01/2020 Elsevier Patient Education  2022 [employer] Inc.  

## 2020-08-23 DIAGNOSIS — R079 Chest pain, unspecified: Secondary | ICD-10-CM | POA: Diagnosis not present

## 2020-08-25 ENCOUNTER — Encounter: Payer: Self-pay | Admitting: Family Medicine

## 2020-08-25 NOTE — Telephone Encounter (Signed)
Please advise on message below.

## 2020-08-25 NOTE — Telephone Encounter (Signed)
I would encourage patient to come in for an appointment so that I can examine her in person.  She can be placed in the 9 AM or 1130 slot tomorrow if still open.

## 2020-08-26 NOTE — Telephone Encounter (Signed)
FYI

## 2020-08-26 NOTE — Telephone Encounter (Signed)
Patient called back to request an appt with Dr. Claiborne Billings today. States she is improving some but wanted an appt today, if possible with Dr. Claiborne Billings

## 2020-08-29 ENCOUNTER — Other Ambulatory Visit: Payer: Self-pay

## 2020-08-29 ENCOUNTER — Encounter: Payer: Self-pay | Admitting: Family Medicine

## 2020-08-29 ENCOUNTER — Ambulatory Visit (INDEPENDENT_AMBULATORY_CARE_PROVIDER_SITE_OTHER): Payer: BC Managed Care – PPO | Admitting: Family Medicine

## 2020-08-29 VITALS — BP 95/59 | HR 69 | Temp 98.0°F | Ht 62.0 in | Wt 125.0 lb

## 2020-08-29 DIAGNOSIS — S70361A Insect bite (nonvenomous), right thigh, initial encounter: Secondary | ICD-10-CM

## 2020-08-29 DIAGNOSIS — W57XXXA Bitten or stung by nonvenomous insect and other nonvenomous arthropods, initial encounter: Secondary | ICD-10-CM | POA: Diagnosis not present

## 2020-08-29 DIAGNOSIS — T732XXA Exhaustion due to exposure, initial encounter: Secondary | ICD-10-CM

## 2020-08-29 DIAGNOSIS — R0602 Shortness of breath: Secondary | ICD-10-CM

## 2020-08-29 NOTE — Patient Instructions (Signed)
I agree with cardiology to see you.   We will call you with lab results once we receive them.

## 2020-08-29 NOTE — Progress Notes (Signed)
Patient Care Team    Relationship Specialty Notifications Start End  Natalia Leatherwood, DO PCP - General Family Medicine  10/18/17   Olivia Mackie, MD Consulting Physician Obstetrics and Gynecology  10/18/17     SUBJECTIVE Chief Complaint  Patient presents with   Cough    F/u    HPI: Sarah Mullins is a female present for follow-up on shortness of breath.  Patient returns today to follow-up on her shortness of breath.  Chest x-ray was normal.  CBC normal.  She did have an elevated D-dimer of 0.90.  She was encouraged to report to the emergency room for further emergent evaluation in which she did.  CT was negative for PE.  TSH was normal.  CBC and BMP normal in ED.  Vitals were stable oxygen saturation was normal.  She has been referred to cardiology to evaluate for possible arrhythmia.  She reports mild improvement in her symptoms since last week.  She is still feeling extremely fatigued.  He again denies fever, chills, nausea, vomit, diarrhea, lower extremity pain or swelling or chest pain.  Reports she did have a tick exposure about 2 months ago.  Tick was removed from her right buttock/thigh area.  She reports she had a round red rash about the size of her palm at this location.  No other rashes, fever, headache present.  Prior note: Sx start: 3 days with cough- more of a tickle feeling, but id have one event of coughing fit with mild production.  Vaccine:declined.  Pt endorses chest tightness, low grade temp x1 days. PND, dry cough.  Pt denies chills, nausea, bowel changes or vomit. She denies calf tenderness or swelling.  He daughter was ill last week.  Otc: advil She reports a trip to [place] over the last week - she drove both directions straight through. There is not a blood clot history in the family. She states her OB had told her he was concerned for disorder with her h/o miscarriages- but she is uncertain the name.  She and her family had covid about 7 weeks ago. She is  not on BC.    ROS: See pertinent positives and negatives per HPI.  Patient Active Problem List   Diagnosis Date Noted   Multinodular goiter 10/14/2019   Thyroid nodule 10/06/2018    Social History   Tobacco Use   Smoking status: Never   Smokeless tobacco: Never  Substance Use Topics   Alcohol use: Yes    Current Outpatient Medications:    b complex vitamins tablet, Take 1 tablet by mouth daily., Disp: , Rfl:    Multiple Vitamin (MULTIVITAMIN) capsule, Take 1 capsule by mouth daily., Disp: , Rfl:   Allergies  Allergen Reactions   Beta Adrenergic Blockers     REACTION: circulation problems    OBJECTIVE: BP (!) 95/59   Pulse 69   Temp 98 F (36.7 C) (Oral)   Ht 5\' 2"  (1.575 m)   Wt 125 lb (56.7 kg)   SpO2 100%   BMI 22.86 kg/m  Gen: Afebrile. No acute distress.  Nontoxic.  Pleasant female. HENT: AT. [place]. Bilateral TM visualized and normal in appearance. MMM. Bilateral nares without erythema or swelling. Throat without erythema or exudates.  No cough.  No hoarseness.  No shortness of breath on exam today. Eyes:Pupils Equal Round Reactive to light, Extraocular movements intact,  Conjunctiva without redness, discharge or icterus. Neck/lymp/endocrine: Supple, no lymphadenopathy, no thyromegaly CV: RRR no murmur, no edema,  Chest: CTAB, no wheeze or crackles Skin: no rashes, purpura or petechiae.  Neuro:  Normal gait. PERLA. EOMi. Alert. Oriented x3 Psych: Normal affect, dress and demeanor. Normal speech. Normal thought content and judgment.    ASSESSMENT AND PLAN: Sarah Mullins is a 35 y.o. female present for  Fatigue/shortness of breath/tick exposure -Agree with cardiology referral.  She reports she has an appointment next week. -Lyme titers collected today, she did have tick exposure. Uncertain etiology of her constellation of symptoms, possibly viral related.  Elevated D-dimer may have been secondary to COVID a few weeks prior.   Felix Pacini,  DO 08/29/2020   Return if symptoms worsen or fail to improve.  Orders Placed This Encounter  Procedures   B. burgdorfi antibodies    No orders of the defined types were placed in this encounter.  Referral Orders  No referral(s) requested today

## 2020-08-30 LAB — B. BURGDORFI ANTIBODIES: B burgdorferi Ab IgG+IgM: <0.9 index

## 2020-09-14 DIAGNOSIS — R0600 Dyspnea, unspecified: Secondary | ICD-10-CM | POA: Diagnosis not present

## 2020-09-21 DIAGNOSIS — R0600 Dyspnea, unspecified: Secondary | ICD-10-CM | POA: Diagnosis not present

## 2020-12-14 DIAGNOSIS — Z124 Encounter for screening for malignant neoplasm of cervix: Secondary | ICD-10-CM | POA: Diagnosis not present

## 2020-12-14 DIAGNOSIS — Z01419 Encounter for gynecological examination (general) (routine) without abnormal findings: Secondary | ICD-10-CM | POA: Diagnosis not present

## 2020-12-14 DIAGNOSIS — Z01411 Encounter for gynecological examination (general) (routine) with abnormal findings: Secondary | ICD-10-CM | POA: Diagnosis not present

## 2020-12-14 DIAGNOSIS — Z6823 Body mass index (BMI) 23.0-23.9, adult: Secondary | ICD-10-CM | POA: Diagnosis not present

## 2020-12-19 DIAGNOSIS — Z3201 Encounter for pregnancy test, result positive: Secondary | ICD-10-CM | POA: Diagnosis not present

## 2020-12-19 DIAGNOSIS — Z3200 Encounter for pregnancy test, result unknown: Secondary | ICD-10-CM | POA: Diagnosis not present

## 2020-12-19 DIAGNOSIS — Z3689 Encounter for other specified antenatal screening: Secondary | ICD-10-CM | POA: Diagnosis not present

## 2020-12-19 DIAGNOSIS — N96 Recurrent pregnancy loss: Secondary | ICD-10-CM | POA: Diagnosis not present

## 2021-01-17 DIAGNOSIS — Z3201 Encounter for pregnancy test, result positive: Secondary | ICD-10-CM | POA: Diagnosis not present

## 2021-02-01 DIAGNOSIS — O09521 Supervision of elderly multigravida, first trimester: Secondary | ICD-10-CM | POA: Diagnosis not present

## 2021-02-01 DIAGNOSIS — Z3A10 10 weeks gestation of pregnancy: Secondary | ICD-10-CM | POA: Diagnosis not present

## 2021-02-01 DIAGNOSIS — O2621 Pregnancy care for patient with recurrent pregnancy loss, first trimester: Secondary | ICD-10-CM | POA: Diagnosis not present

## 2021-02-01 DIAGNOSIS — Z3689 Encounter for other specified antenatal screening: Secondary | ICD-10-CM | POA: Diagnosis not present

## 2021-02-01 LAB — OB RESULTS CONSOLE RUBELLA ANTIBODY, IGM: Rubella: IMMUNE

## 2021-02-01 LAB — OB RESULTS CONSOLE HIV ANTIBODY (ROUTINE TESTING): HIV: NONREACTIVE

## 2021-02-01 LAB — OB RESULTS CONSOLE HEPATITIS B SURFACE ANTIGEN: Hepatitis B Surface Ag: NEGATIVE

## 2021-02-01 LAB — OB RESULTS CONSOLE RPR: RPR: NONREACTIVE

## 2021-02-12 NOTE — L&D Delivery Note (Signed)
   Delivery Note:   [id number] at 104w2d  Admitting diagnosis: Normal labor [O80, Z37.9] Risks: GBS + Onset of labor: 08/29/2021 at 2300 IOL/Augmentation: AROM ROM: 08/30/2021 at 0448, clear fluid  Complete dilation at 08/30/2021  0600 Onset of pushing at 0600 FHR second stage Cat I  Analgesia/Anesthesia intrapartum:None  Pushing in right side lying position with CNM and L&D staff support. Husband, Sarah Mullins, present for birth and supportive.  Delivery of a Live born female  Birth Weight:  pending APGAR: 9, 9  Newborn Delivery   Birth date/time: 08/30/2021 06:14:00 Delivery type: Vaginal, Spontaneous     in cephalic presentation, position OA to ROA.  APGAR:1 min-9 , 5 min-9   Nuchal Cord: No  Cord double clamped after cessation of pulsation, cut by Sarah Mullins.  Collection of cord blood for typing completed. Arterial cord blood sample-No    Placenta delivered-Spontaneous  with 3 vessels . Uterotonics: Pitocin Placenta to L&D Uterine tone firm  Bleeding scant  None  laceration identified.  Episiotomy:None  Local analgesia: N/A  Repair: N/A Est. Blood Loss (mL):50.00   Complications: None  Mom to postpartum.  Baby Sarah Mullins to Couplet care / Skin to Skin.  Delivery Report:   Review the Delivery Report for details.    Sarah Mullins, CNM, MSN 08/30/2021, 7:14 AM

## 2021-02-23 DIAGNOSIS — O09521 Supervision of elderly multigravida, first trimester: Secondary | ICD-10-CM | POA: Diagnosis not present

## 2021-02-23 DIAGNOSIS — O2621 Pregnancy care for patient with recurrent pregnancy loss, first trimester: Secondary | ICD-10-CM | POA: Diagnosis not present

## 2021-02-23 DIAGNOSIS — Z3A13 13 weeks gestation of pregnancy: Secondary | ICD-10-CM | POA: Diagnosis not present

## 2021-02-23 LAB — OB RESULTS CONSOLE GC/CHLAMYDIA
Chlamydia: NEGATIVE
Neisseria Gonorrhea: NEGATIVE

## 2021-02-28 IMAGING — US US FNA BIOPSY THYROID 1ST LESION
1 series · 13 of 24 positions shown · non-contrast
Comparison: Thyroid ultrasound performed October 07, 2019 as well as
previous thyroid biopsy 10/06/2018

MEDICATIONS:
None

COMPLICATIONS:
None immediate.

INDICATION: Indeterminate thyroid right inferior nodule

EXAM:
ULTRASOUND GUIDED FINE NEEDLE ASPIRATION OF INDETERMINATE RIGHT
INFERIOR THYROID NODULE
TECHNIQUE: Informed written consent was obtained from the patient after a
discussion of the risks, benefits and alternatives to treatment.
Questions regarding the procedure were encouraged and answered. A
timeout was performed prior to the initiation of the procedure.

[Series 1: us fna biopsy thyroid 1st lesion · 0.05mm/px · 24 acquisitions, 13 frames shown]
[im 1/24]
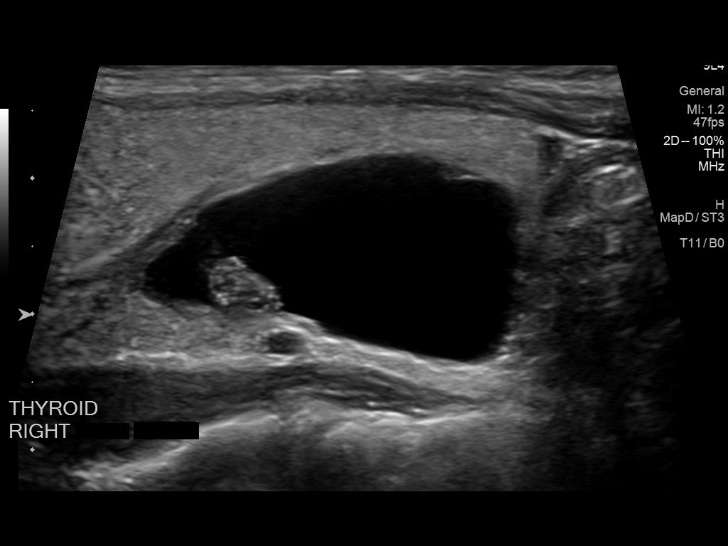
[im 3/24]
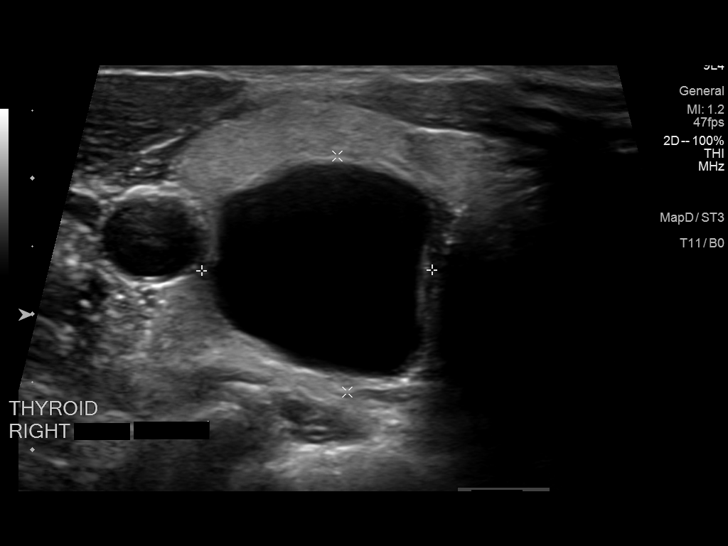
[im 5/24]
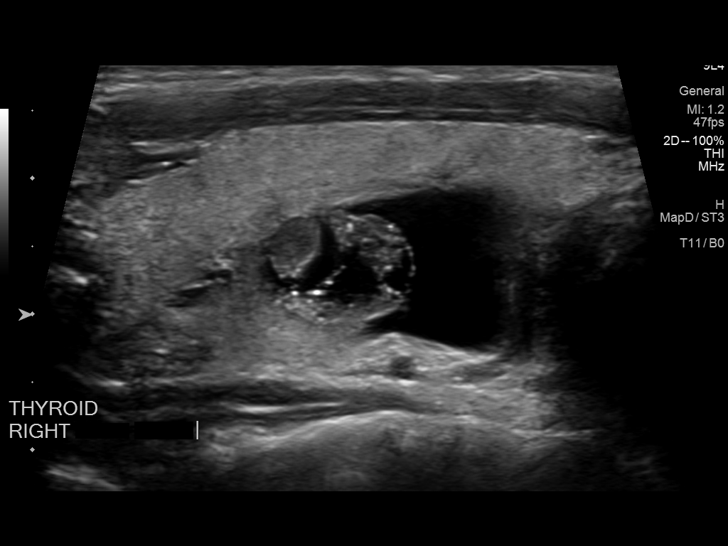
[im 7/24]
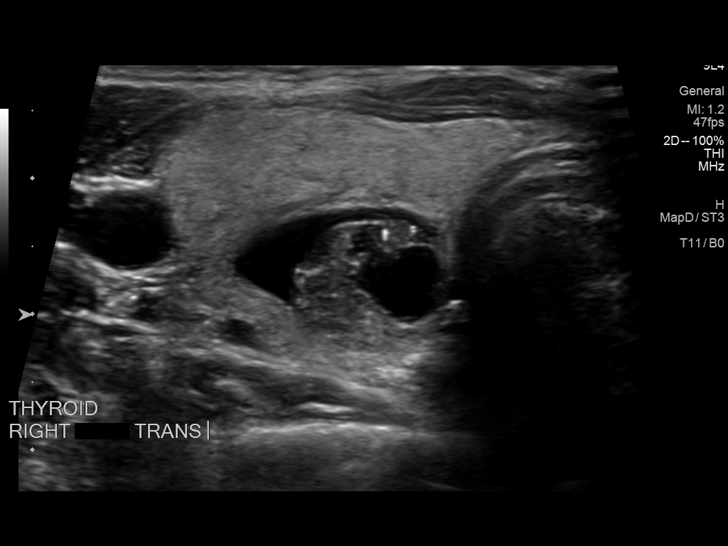
[im 9/24]
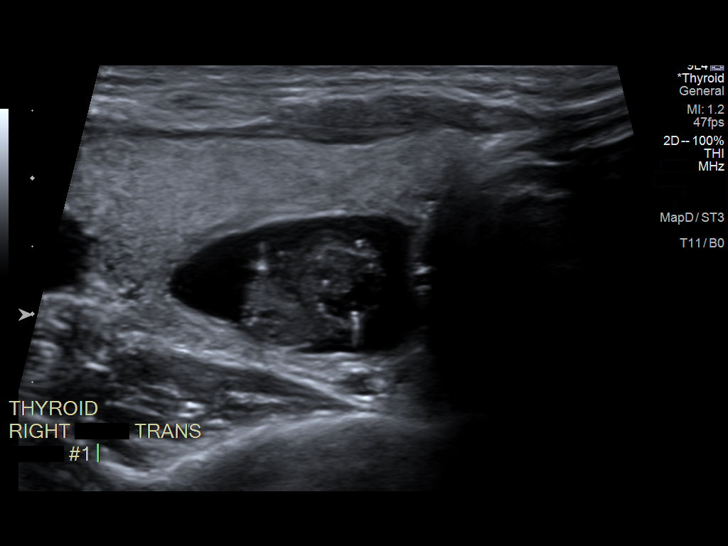
[im 11/24]
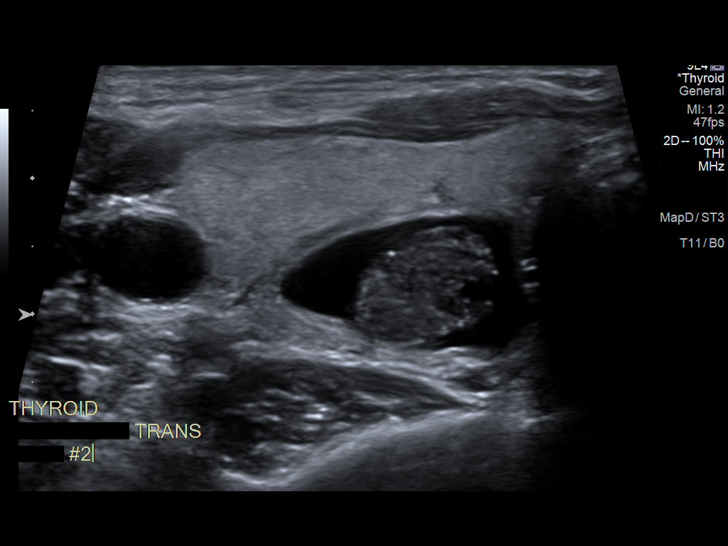
[im 13/24]
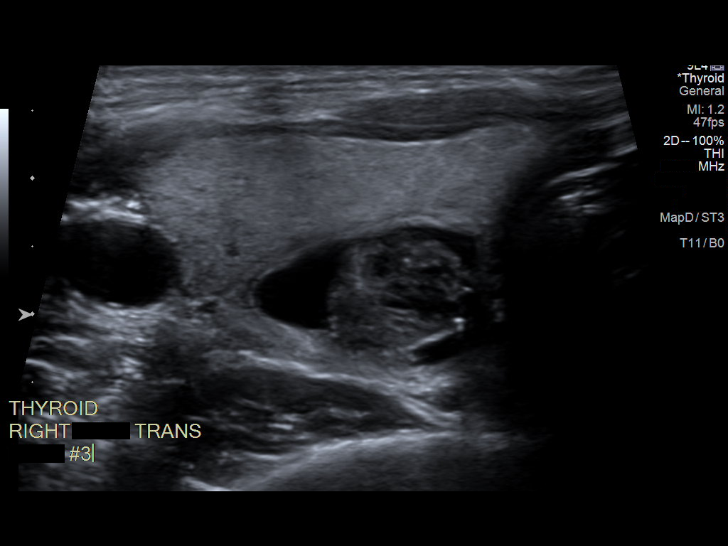
[im 14/24]
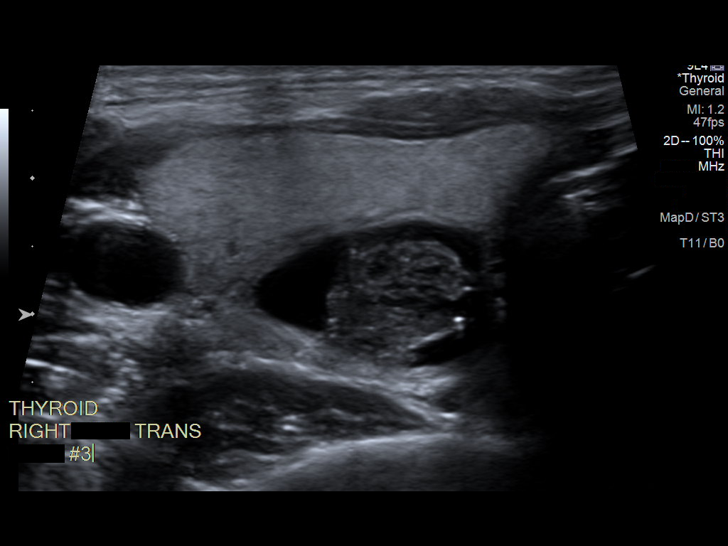
[im 16/24]
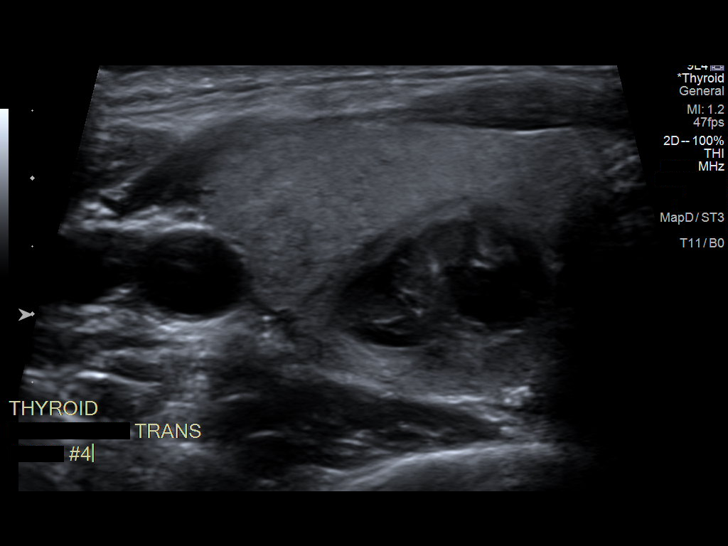
[im 18/24]
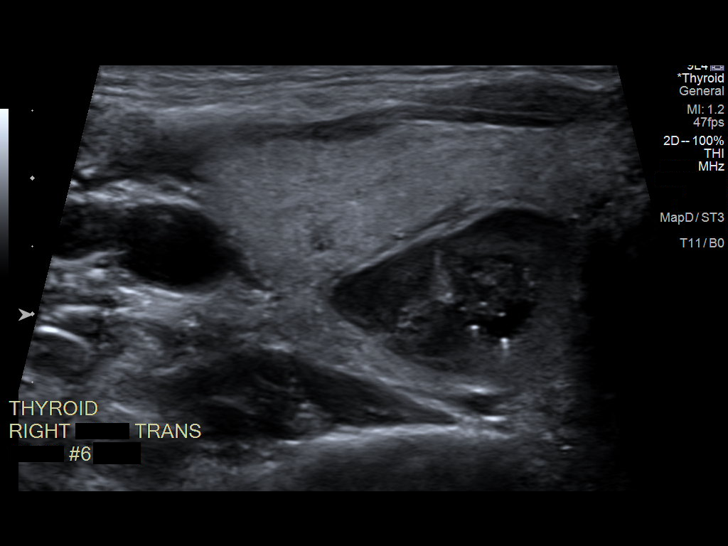
[im 20/24]
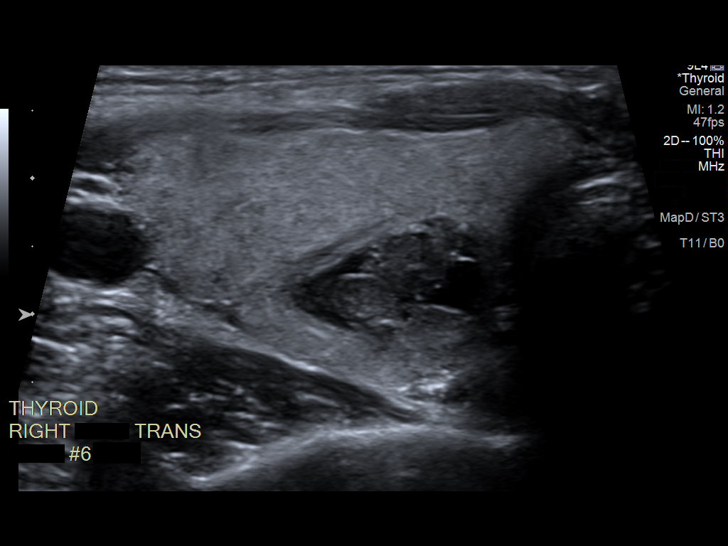
[im 22/24]
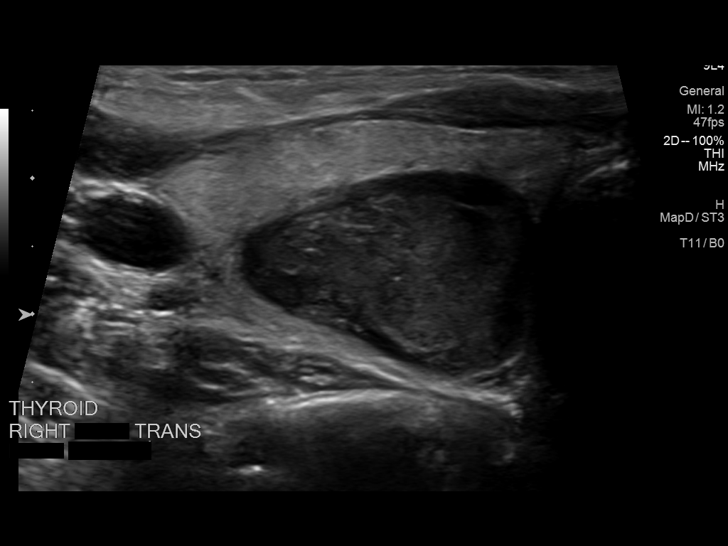
[im 24/24]
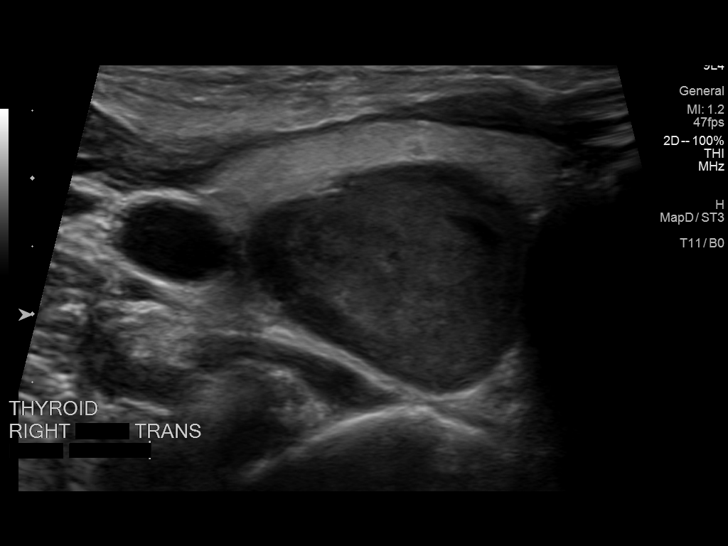

[13 of 24 positions shown; findings below may reference images not displayed]

Pre-procedural ultrasound scanning demonstrated unchanged size and
appearance of the indeterminate nodule within the right thyroid lobe

The procedure was planned. The neck was prepped in the usual sterile
fashion, and a sterile drape was applied covering the operative
field. A timeout was performed prior to the initiation of the
procedure. Local anesthesia was provided with 1% lidocaine.

Under direct ultrasound guidance, 5 FNA biopsies were performed of
the right inferior thyroid nodule with a 25 gauge needle. An 18
gauge needle was then used to aspirate approximately 3 mL of bloody
cystic fluid. Multiple ultrasound images were saved for procedural
documentation purposes. The samples were prepared and submitted to
pathology.

Limited post procedural scanning was negative for hematoma or
additional complication. Dressings were placed. The patient
tolerated the above procedures procedure well without immediate
postprocedural complication.
FINDINGS: FINDINGS
Nodule reference number based on prior diagnostic ultrasound: 2

Maximum size: 2.8 cm

Location: Right  ;  Inferior

ACR TI-RADS total points: 3

ACR TI-RADS risk category:  TR3 (3 points)

Prior biopsy:  Yes

Reason for biopsy: meets ACR TI-RADS criteria

Ultrasound imaging confirms appropriate placement of the needles
within the thyroid nodule.
IMPRESSION: Technically successful ultrasound guided fine needle aspiration of
right inferior thyroid nodule as described above.

## 2021-03-16 DIAGNOSIS — Z361 Encounter for antenatal screening for raised alphafetoprotein level: Secondary | ICD-10-CM | POA: Diagnosis not present

## 2021-04-04 DIAGNOSIS — O09522 Supervision of elderly multigravida, second trimester: Secondary | ICD-10-CM | POA: Diagnosis not present

## 2021-04-04 DIAGNOSIS — Z3A19 19 weeks gestation of pregnancy: Secondary | ICD-10-CM | POA: Diagnosis not present

## 2021-05-29 DIAGNOSIS — Z3A27 27 weeks gestation of pregnancy: Secondary | ICD-10-CM | POA: Diagnosis not present

## 2021-05-29 DIAGNOSIS — O2622 Pregnancy care for patient with recurrent pregnancy loss, second trimester: Secondary | ICD-10-CM | POA: Diagnosis not present

## 2021-05-29 DIAGNOSIS — Z3689 Encounter for other specified antenatal screening: Secondary | ICD-10-CM | POA: Diagnosis not present

## 2021-06-13 DIAGNOSIS — Z3689 Encounter for other specified antenatal screening: Secondary | ICD-10-CM | POA: Diagnosis not present

## 2021-06-29 DIAGNOSIS — O2623 Pregnancy care for patient with recurrent pregnancy loss, third trimester: Secondary | ICD-10-CM | POA: Diagnosis not present

## 2021-06-29 DIAGNOSIS — Z3A31 31 weeks gestation of pregnancy: Secondary | ICD-10-CM | POA: Diagnosis not present

## 2021-07-03 DIAGNOSIS — M9901 Segmental and somatic dysfunction of cervical region: Secondary | ICD-10-CM | POA: Diagnosis not present

## 2021-07-03 DIAGNOSIS — M9905 Segmental and somatic dysfunction of pelvic region: Secondary | ICD-10-CM | POA: Diagnosis not present

## 2021-07-03 DIAGNOSIS — M9903 Segmental and somatic dysfunction of lumbar region: Secondary | ICD-10-CM | POA: Diagnosis not present

## 2021-07-03 DIAGNOSIS — M9904 Segmental and somatic dysfunction of sacral region: Secondary | ICD-10-CM | POA: Diagnosis not present

## 2021-07-27 DIAGNOSIS — Z3685 Encounter for antenatal screening for Streptococcus B: Secondary | ICD-10-CM | POA: Diagnosis not present

## 2021-07-27 DIAGNOSIS — O09522 Supervision of elderly multigravida, second trimester: Secondary | ICD-10-CM | POA: Diagnosis not present

## 2021-07-27 DIAGNOSIS — Z3A35 35 weeks gestation of pregnancy: Secondary | ICD-10-CM | POA: Diagnosis not present

## 2021-08-01 LAB — OB RESULTS CONSOLE GBS: GBS: POSITIVE

## 2021-08-30 ENCOUNTER — Encounter (HOSPITAL_COMMUNITY): Payer: Self-pay | Admitting: *Deleted

## 2021-08-30 ENCOUNTER — Inpatient Hospital Stay (HOSPITAL_COMMUNITY)
Admission: AD | Admit: 2021-08-30 | Discharge: 2021-08-31 | DRG: 807 | Disposition: A | Discharge status: Home / Self Care | Payer: BC Managed Care – PPO | Attending: Obstetrics and Gynecology | Admitting: Obstetrics and Gynecology

## 2021-08-30 ENCOUNTER — Other Ambulatory Visit: Payer: Self-pay

## 2021-08-30 DIAGNOSIS — Z3A40 40 weeks gestation of pregnancy: Secondary | ICD-10-CM

## 2021-08-30 DIAGNOSIS — O99824 Streptococcus B carrier state complicating childbirth: Secondary | ICD-10-CM | POA: Diagnosis not present

## 2021-08-30 DIAGNOSIS — O480 Post-term pregnancy: Secondary | ICD-10-CM | POA: Diagnosis not present

## 2021-08-30 DIAGNOSIS — Z7982 Long term (current) use of aspirin: Secondary | ICD-10-CM

## 2021-08-30 DIAGNOSIS — Z412 Encounter for routine and ritual male circumcision: Secondary | ICD-10-CM | POA: Diagnosis not present

## 2021-08-30 DIAGNOSIS — B951 Streptococcus, group B, as the cause of diseases classified elsewhere: Secondary | ICD-10-CM | POA: Diagnosis present

## 2021-08-30 DIAGNOSIS — Z2882 Immunization not carried out because of caregiver refusal: Secondary | ICD-10-CM | POA: Diagnosis not present

## 2021-08-30 LAB — CBC
HCT: 37.7 % (ref 36.0–46.0)
Hemoglobin: 12.4 g/dL (ref 12.0–15.0)
MCH: 29 pg (ref 26.0–34.0)
MCHC: 32.9 g/dL (ref 30.0–36.0)
MCV: 88.1 fL (ref 80.0–100.0)
Platelets: 240 10*3/uL (ref 150–400)
RBC: 4.28 MIL/uL (ref 3.87–5.11)
RDW: 13.8 % (ref 11.5–15.5)
WBC: 9.6 10*3/uL (ref 4.0–10.5)
nRBC: 0 % (ref 0.0–0.2)

## 2021-08-30 LAB — TYPE AND SCREEN
ABO/RH(D): O POS
Antibody Screen: NEGATIVE

## 2021-08-30 LAB — RPR: RPR Ser Ql: NONREACTIVE

## 2021-08-30 MED ORDER — ACETAMINOPHEN 325 MG PO TABS: 650.0000 mg | ORAL_TABLET | ORAL | Status: DC | PRN

## 2021-08-30 MED ORDER — ONDANSETRON HCL 4 MG/2ML IJ SOLN: 4.0000 mg | Freq: Four times a day (QID) | INTRAMUSCULAR | Status: DC | PRN

## 2021-08-30 MED ORDER — SODIUM CHLORIDE 0.9 % IV SOLN: 1.0000 g | INTRAVENOUS | Status: DC

## 2021-08-30 MED ORDER — DIBUCAINE (PERIANAL) 1 % EX OINT: 1.0000 | TOPICAL_OINTMENT | CUTANEOUS | Status: DC | PRN

## 2021-08-30 MED ORDER — ONDANSETRON HCL 4 MG/2ML IJ SOLN: 4.0000 mg | INTRAMUSCULAR | Status: DC | PRN

## 2021-08-30 MED ORDER — LACTATED RINGERS IV SOLN: INTRAVENOUS | Status: DC

## 2021-08-30 MED ORDER — SENNOSIDES-DOCUSATE SODIUM 8.6-50 MG PO TABS
2.0000 | ORAL_TABLET | Freq: Every day | ORAL | Status: DC
  Administered 2021-08-31: 2 via ORAL
  Filled 2021-08-30: qty 2

## 2021-08-30 MED ORDER — LACTATED RINGERS IV SOLN: 500.0000 mL | INTRAVENOUS | Status: DC | PRN

## 2021-08-30 MED ORDER — SODIUM CHLORIDE 0.9% FLUSH: 3.0000 mL | INTRAVENOUS | Status: DC | PRN

## 2021-08-30 MED ORDER — WITCH HAZEL-GLYCERIN EX PADS: 1.0000 | MEDICATED_PAD | CUTANEOUS | Status: DC | PRN

## 2021-08-30 MED ORDER — OXYTOCIN BOLUS FROM INFUSION
333.0000 mL | Freq: Once | INTRAVENOUS | Status: AC
  Administered 2021-08-30: 333 mL via INTRAVENOUS

## 2021-08-30 MED ORDER — OXYTOCIN 10 UNIT/ML IJ SOLN: 10.0000 [IU] | Freq: Once | INTRAMUSCULAR | Status: DC

## 2021-08-30 MED ORDER — ONDANSETRON HCL 4 MG PO TABS: 4.0000 mg | ORAL_TABLET | ORAL | Status: DC | PRN

## 2021-08-30 MED ORDER — SODIUM CHLORIDE 0.9% FLUSH: 3.0000 mL | Freq: Two times a day (BID) | INTRAVENOUS | Status: DC

## 2021-08-30 MED ORDER — IBUPROFEN 600 MG PO TABS
600.0000 mg | ORAL_TABLET | Freq: Four times a day (QID) | ORAL | Status: DC
  Administered 2021-08-30 – 2021-08-31 (×5): 600 mg via ORAL
  Filled 2021-08-30 (×5): qty 1

## 2021-08-30 MED ORDER — FENTANYL CITRATE (PF) 100 MCG/2ML IJ SOLN: 50.0000 ug | INTRAMUSCULAR | Status: DC | PRN

## 2021-08-30 MED ORDER — PRENATAL MULTIVITAMIN CH
1.0000 | ORAL_TABLET | Freq: Every day | ORAL | Status: DC
  Filled 2021-08-30 (×2): qty 1

## 2021-08-30 MED ORDER — TETANUS-DIPHTH-ACELL PERTUSSIS 5-2.5-18.5 LF-MCG/0.5 IM SUSY: 0.5000 mL | PREFILLED_SYRINGE | Freq: Once | INTRAMUSCULAR | Status: DC

## 2021-08-30 MED ORDER — SODIUM CHLORIDE 0.9 % IV SOLN: INTRAVENOUS | Status: DC | PRN

## 2021-08-30 MED ORDER — BENZOCAINE-MENTHOL 20-0.5 % EX AERO: 1.0000 | INHALATION_SPRAY | CUTANEOUS | Status: DC | PRN

## 2021-08-30 MED ORDER — SIMETHICONE 80 MG PO CHEW: 80.0000 mg | CHEWABLE_TABLET | ORAL | Status: DC | PRN

## 2021-08-30 MED ORDER — LIDOCAINE HCL (PF) 1 % IJ SOLN: 30.0000 mL | INTRAMUSCULAR | Status: DC | PRN

## 2021-08-30 MED ORDER — ACETAMINOPHEN 500 MG PO TABS: 1000.0000 mg | ORAL_TABLET | Freq: Four times a day (QID) | ORAL | Status: DC | PRN

## 2021-08-30 MED ORDER — SODIUM CHLORIDE 0.9 % IV SOLN
2.0000 g | Freq: Once | INTRAVENOUS | Status: AC
  Administered 2021-08-30: 2 g via INTRAVENOUS
  Filled 2021-08-30: qty 2000

## 2021-08-30 MED ORDER — ZOLPIDEM TARTRATE 5 MG PO TABS: 5.0000 mg | ORAL_TABLET | Freq: Every evening | ORAL | Status: DC | PRN

## 2021-08-30 MED ORDER — SOD CITRATE-CITRIC ACID 500-334 MG/5ML PO SOLN: 30.0000 mL | ORAL | Status: DC | PRN

## 2021-08-30 MED ORDER — DIPHENHYDRAMINE HCL 25 MG PO CAPS: 25.0000 mg | ORAL_CAPSULE | Freq: Four times a day (QID) | ORAL | Status: DC | PRN

## 2021-08-30 MED ORDER — OXYTOCIN-SODIUM CHLORIDE 30-0.9 UT/500ML-% IV SOLN
2.5000 [IU]/h | INTRAVENOUS | Status: DC
  Administered 2021-08-30: 2.5 [IU]/h via INTRAVENOUS
  Filled 2021-08-30: qty 500

## 2021-08-30 MED ORDER — COCONUT OIL OIL: 1.0000 | TOPICAL_OIL | Status: DC | PRN

## 2021-08-30 NOTE — MAU Note (Signed)
.  Sarah Mullins is a 36 y.o. at [redacted]w[redacted]d here in MAU reporting contractions since 2300. Leaking clear fld since 0230. Good FM. No VB. Was 4cm last Fri. Positive GBS  Onset of complaint: 2300 Pain score: 5 Vitals:   08/30/21 0327 08/30/21 0331  BP:  110/68  Pulse: 70   Resp: 17   Temp: (!) 97.5 F (36.4 C)   SpO2: 100%      FHT:131 Lab orders placed from triage:  mau labor eval

## 2021-08-30 NOTE — Plan of Care (Signed)

## 2021-08-30 NOTE — Lactation Note (Signed)
This note was copied from a baby's chart. Lactation Consultation Note  Patient Name: Sarah Mullins [id number] Date: 08/30/2021 Reason for consult: Initial assessment;Mother's request;Term;Breastfeeding assistance Age:38 hours  Mom experienced with breastfeeding and states feeding going well. She has hx of Raynaud's states affected her hand only when she lived in [place].   Plan 1. To feed 8-12x 24hr period. Mom to offer breasts and look for signs of milk transfer 2 If unable to supplement, Mom to offer EBM via spoon 5-7 ml All questions answered at the end of the visit.   Maternal Data Has patient been taught Hand Expression?: Yes Does the patient have breastfeeding experience prior to this delivery?: Yes How long did the patient breastfeed?: Mom is experienced with breastfeeding ( last child for 8 months)  Feeding Mother's Current Feeding Choice: Breast Milk  LATCH Score                    Lactation Tools Discussed/Used    Interventions Interventions: Breast feeding basics reviewed;Skin to skin;Hand express;Expressed milk;Education;LC Psychologist, educational;Infant Driven Feeding Algorithm education  Discharge Pump: Personal  Consult Status Consult Status: PRN Date: 08/31/21 Follow-up type: In-patient    Sarah Goetzke  Mullins 08/30/2021, 4:57 PM

## 2021-08-30 NOTE — H&P (Signed)
OB ADMISSION/ HISTORY & PHYSICAL:  Admission Date: 08/30/2021  3:12 AM  Admit Diagnosis: Normal labor [O80, Z37.9]    Sarah Mullins is a 36 y.o. female [phone] at [redacted]w[redacted]d presenting for contractions since 2300. Patient states contractions started at 2300 and were 12-14 minutes apart until 0200 when her water broke. Denies vaginal bleeding. Endorses + fetal movement. Husband, [name], present and supportive. Eagerly anticipating baby boy "Zebidiah".   Prenatal History: [id number]   EDC: 08/28/2021 Prenatal care at [facility] since 10 weeks  Primary: Dr. Billy Coast, CNM care in his absence  Prenatal course complicated by: History of borderline ACL titer, on bbASA GBS positive AMA RPL, unexplained  Prenatal Labs: ABO, Rh:   O POS Antibody:   Negative Rubella:   Immune RPR:   Non-reactive HBsAg:   Negative HIV:   Negative GBS:   Positive 1 hr Glucola : 112 Genetic Screening: Declines, negative AFP-1 Ultrasound: normal XY anatomy    Maternal Diabetes: No Genetic Screening: Declined Maternal Ultrasounds/Referrals: Normal Fetal Ultrasounds or other Referrals:  None Maternal Substance Abuse:  No Significant Maternal Medications:  None Significant Maternal Lab Results:  Group B Strep positive Other Comments:  None  Medical / Surgical History : Past medical history:  Past Medical History:  Diagnosis Date   Chicken pox    Chronic constipation    Concussion    Instability of joint    right knee   Multinodular goiter 2020   euthyroid, bx benign   Raynaud's phenomenon    mild    Past surgical history:  Past Surgical History:  Procedure Laterality Date   APPENDECTOMY  2002   WISDOM TOOTH EXTRACTION      Family History:  Family History  Problem Relation Age of Onset   Uterine cancer Maternal Grandmother    Dementia Paternal Grandfather        senile dementia or possible stroke   Cervical cancer Mother    Heart disease Maternal Grandfather    Hearing loss Paternal  Grandmother    Hypertension Paternal Grandmother     Social History:  reports that she has never smoked. She has never used smokeless tobacco. She reports that she does not currently use alcohol. She reports that she does not use drugs.  Allergies: Beta adrenergic blockers   Current Medications at time of admission:  Medications Prior to Admission  Medication Sig Dispense Refill Last Dose   aspirin EC 81 MG tablet Take 81 mg by mouth daily. Swallow whole.   08/29/2021   Prenatal Vit-Fe Fumarate-FA (PRENATAL MULTIVITAMIN) TABS tablet Take 1 tablet by mouth daily at 12 noon.   08/29/2021   b complex vitamins tablet Take 1 tablet by mouth daily.      Multiple Vitamin (MULTIVITAMIN) capsule Take 1 capsule by mouth daily.       Review of Systems: Review of Systems  All other systems reviewed and are negative.  Physical Exam: Vital signs and nursing notes reviewed.  Patient Vitals for the past 24 hrs:  BP Temp Pulse Resp SpO2 Height Weight  08/30/21 0343 113/65 -- 78 18 100 % -- --  08/30/21 0331 110/68 -- -- -- -- -- --  08/30/21 0327 -- (!) 97.5 F (36.4 C) 70 17 100 % 5\' 1"  (1.549 m) 68 kg    General: AAO x 3, NAD Heart: RRR Lungs:CTAB Abdomen: Gravid, NT Extremities: no edema SVE: Dilation: 6 Effacement (%): 90 Cervical Position: Posterior Station: Plus 1 Presentation: Vertex Exam by:: [email or web address],  RN   FHR: 135BPM, moderate variability, + accels, no decels TOCO: Contractions irregular  Labs:   No results for input(s): "WBC", "HGB", "HCT", "PLT" in the last 72 hours.  Assessment/Plan: 36 y.o. [id number] at [redacted]w[redacted]d, active labor with SROM, GBS positive  Fetal wellbeing - FHT category 1 EFW AGA 7-8lbs  Labor: Grand multip presenting in active labor, plan expectant management  GBS Positive, will treat with Ampicillin Rubella Immune Rh positive  Pain control: desires unmedicated birth Analgesia/anesthesia PRN  Anticipated MOD: NSVD  Plans to breastfeed, plans  inpatient circumcision. POC discussed with patient and support team, all questions answered.  Dr. Amado Nash notified of admission/plan of care.  June Leap CNM, MSN 08/30/2021, 4:07 AM

## 2021-08-30 NOTE — Lactation Note (Signed)
This note was copied from a baby's chart. Lactation Consultation Note  Patient Name: Sarah Mullins Date: 08/30/2021   Age:38 hours   LC Consult:  Attempted to visit with mother, however, she was asleep at this time.  No support person present.  Will have evening LC make an initial visit.   Maternal Data    Feeding    LATCH Score                    Lactation Tools Discussed/Used    Interventions    Discharge    Consult Status      Rheana Casebolt R Takao Lizer 08/30/2021, 2:13 PM

## 2021-08-31 LAB — CBC
HCT: 32.8 % — ABNORMAL LOW (ref 36.0–46.0)
Hemoglobin: 10.7 g/dL — ABNORMAL LOW (ref 12.0–15.0)
MCH: 29 pg (ref 26.0–34.0)
MCHC: 32.6 g/dL (ref 30.0–36.0)
MCV: 88.9 fL (ref 80.0–100.0)
Platelets: 213 10*3/uL (ref 150–400)
RBC: 3.69 MIL/uL — ABNORMAL LOW (ref 3.87–5.11)
RDW: 14.1 % (ref 11.5–15.5)
WBC: 10.5 10*3/uL (ref 4.0–10.5)
nRBC: 0 % (ref 0.0–0.2)

## 2021-08-31 MED ORDER — COCONUT OIL OIL: 1.0000 | TOPICAL_OIL | 0 refills | Status: AC | PRN

## 2021-08-31 MED ORDER — ACETAMINOPHEN 325 MG PO TABS: 650.0000 mg | ORAL_TABLET | ORAL | Status: AC | PRN

## 2021-08-31 MED ORDER — BENZOCAINE-MENTHOL 20-0.5 % EX AERO
1.0000 | INHALATION_SPRAY | CUTANEOUS | Status: AC | PRN
Start: 2021-08-31 — End: ?

## 2021-08-31 MED ORDER — IBUPROFEN 600 MG PO TABS: 600.0000 mg | ORAL_TABLET | Freq: Four times a day (QID) | ORAL | 0 refills | Status: AC

## 2021-08-31 NOTE — Discharge Instructions (Signed)
Lactation outpatient support - home visit ° ° °Jessica Bowers, IBCLC (lactation consultant)  & Birth Doula ° °Phone (text or call): [phone] °Email: [email or web address] °[email or web address] ° ° °Linda Coppola °RN, MHA, IBCLC °at [facility]: Lactation Consultant ° °[email or web address]://[email or web address]/ °Mail: [email or web address] °Tel: [phone] ° ° °Additional breastfeeding resources: ° °[employer] [facility] °https://ibconline.ca/information-sheets/ ° °[employer] [place] ° °www.lllofnc.org ° ° °Other Resources: ° °Chiropractic specialist  ° °Dr. Leanna Hastings °[email or web address] ° ° °Craniosacral therapy for baby ° °Erin Balkind  °https://[email or web address]/ ° °

## 2021-08-31 NOTE — Discharge Summary (Signed)
OB Discharge Summary  Patient Name: Sarah Mullins DOB: August 08, 1985 MRN: [id number]  Date of admission: 08/30/2021 Delivering provider: Dorisann Frames K   Admitting diagnosis: Normal labor [O80, Z37.9] Intrauterine pregnancy: [redacted]w[redacted]d     Secondary diagnosis: Patient Active Problem List   Diagnosis Date Noted   Normal labor 08/30/2021   Positive GBS test 08/30/2021   SVD 7/19 08/30/2021   Postpartum care following vaginal delivery 7/19 08/30/2021   Multinodular goiter 10/14/2019   Thyroid nodule 10/06/2018   Additional problems:none   Date of discharge: 08/31/2021   Discharge diagnosis: Principal Problem:   Postpartum care following vaginal delivery 7/19 Active Problems:   Normal labor   Positive GBS test   SVD 7/19                                                              Post partum procedures: none  Augmentation: AROM Pain control: None  Laceration:None  Episiotomy:None  Complications: None  Hospital course:  Onset of Labor With Vaginal Delivery      36 y.o. yo [id number] at [redacted]w[redacted]d was admitted in Active Labor on 08/30/2021. Patient had an uncomplicated labor course as follows:  Membrane Rupture Time/Date: 4:48 AM ,08/30/2021   Delivery Method:Vaginal, Spontaneous  Episiotomy: None  Lacerations:  None  Patient had an uncomplicated postpartum course.  She is ambulating, tolerating a regular diet, passing flatus, and urinating well. Patient is discharged home in stable condition on 08/31/21.  Newborn Data: Birth date:08/30/2021  Birth time:6:14 AM  Gender:Female  Living status:Living  Apgars:9 ,9  Weight:4280 g   Physical exam  Vitals:   08/30/21 1235 08/30/21 1721 08/30/21 2039 08/31/21 0500  BP: 109/66 92/65 99/60  104/66  Pulse: 83 68 70 74  Resp: 18 18 18 18   Temp: 97.6 F (36.4 C) 98 F (36.7 C) 98.5 F (36.9 C) 98.1 F (36.7 C)  TempSrc: Oral   Oral  SpO2:  99%  100%  Weight:      Height:       General: alert, cooperative, and no distress Lochia:  appropriate Uterine Fundus: firm Incision: N/A Perineum: intact, no edema DVT Evaluation: No cords or calf tenderness. No significant calf/ankle edema. Labs: Lab Results  Component Value Date   WBC 10.5 08/31/2021   HGB 10.7 (L) 08/31/2021   HCT 32.8 (L) 08/31/2021   MCV 88.9 08/31/2021   PLT 213 08/31/2021      Latest Ref Rng & Units 11/18/2019    3:01 PM  CMP  Glucose 70 - 99 mg/dL 87   BUN 6 - 23 mg/dL 13   Creatinine 01/18/2020 - 1.20 mg/dL 6.41   Sodium 5.83 - 094 mEq/L 135   Potassium 3.5 - 5.1 mEq/L 4.2   Chloride 96 - 112 mEq/L 101   CO2 19 - 32 mEq/L 28   Calcium 8.4 - 10.5 mg/dL 9.2   Total Protein 6.0 - 8.3 g/dL 7.1   Total Bilirubin 0.2 - 1.2 mg/dL 0.3   Alkaline Phos 39 - 117 U/L 48   AST 0 - 37 U/L 17   ALT 0 - 35 U/L 17       08/30/2021    7:55 AM 01/31/2017    9:00 AM  Edinburgh Postnatal Depression Scale Screening Tool  I have been able  to laugh and see the funny side of things. 0 0  I have looked forward with enjoyment to things. 0 0  I have blamed myself unnecessarily when things went wrong. 0 0  I have been anxious or worried for no good reason. 1 0  I have felt scared or panicky for no good reason. 0 0  Things have been getting on top of me. 1 0  I have been so unhappy that I have had difficulty sleeping. 0 0  I have felt sad or miserable. 0 0  I have been so unhappy that I have been crying. 0 0  The thought of harming myself has occurred to me. 0 0  Edinburgh Postnatal Depression Scale Total 2 0   Vaccines: TDaP          declined   Discharge instruction:  per After Visit Summary,  [facility] booklet and  "Understanding Mother & Baby Care" hospital booklet  After Visit Meds:  Allergies as of 08/31/2021       Reactions   Beta Adrenergic Blockers Other (See Comments)   circulation problems        Medication List     STOP taking these medications    aspirin EC 81 MG tablet       TAKE these medications    acetaminophen 325 MG  tablet Commonly known as: Tylenol Take 2 tablets (650 mg total) by mouth every 4 (four) hours as needed (for pain scale < 4).   b complex vitamins tablet Take 1 tablet by mouth daily.   benzocaine-Menthol 20-0.5 % Aero Commonly known as: DERMOPLAST Apply 1 Application topically as needed for irritation (perineal discomfort).   coconut oil Oil Apply 1 Application topically as needed.   ibuprofen 600 MG tablet Commonly known as: ADVIL Take 1 tablet (600 mg total) by mouth every 6 (six) hours.   multivitamin capsule Take 1 capsule by mouth daily.   prenatal multivitamin Tabs tablet Take 2 tablets by mouth in the morning, at noon, and at bedtime.               Discharge Care Instructions  (From admission, onward)           Start     Ordered   08/31/21 0000  Discharge wound care:       Comments: Sitz baths 2 times /day with warm water x 1 week. May add herbals: 1 ounce dried comfrey leaf* 1 ounce calendula flowers 1 ounce lavender flowers  Supplies can be found online at [employer] sources at [employer], [employer]  1/2 ounce dried uva ursi leaves 1/2 ounce witch hazel blossoms (if you can find them) 1/2 ounce dried sage leaf 1/2 cup sea salt Directions: Bring 2 quarts of water to a boil. Turn off heat, and place 1 ounce (approximately 1 large handful) of the above mixed herbs (not the salt) into the pot. Steep, covered, for 30 minutes.  Strain the liquid well with a fine mesh strainer, and discard the herb material. Add 2 quarts of liquid to the tub, along with the 1/2 cup of salt. This medicinal liquid can also be made into compresses and peri-rinses.   08/31/21 1047            Diet: routine diet  Activity: Advance as tolerated. Pelvic rest for 6 weeks.   Postpartum contraception: TBA in office  Newborn Data: Live born female  Birth Weight: 9 lb 7 oz (4280 g) APGAR:  9, 9  Newborn Delivery   Birth date/time: 08/30/2021  06:14:00 Delivery type: Vaginal, Spontaneous      named Zebidiah Baby Feeding: Breast Disposition:home with mother Circumcision: completed inpatient / [facility], [place]   Delivery Report:  Review the Delivery Report for details.    Follow up:  Follow-up Information     Olivia Mackie, MD Follow up.   Specialty: Obstetrics and Gynecology Contact information: [address] [place] [address] [phone]                   Signed: Cipriano Mile, MSN 08/31/2021, 10:47 AM

## 2021-09-07 ENCOUNTER — Telehealth (HOSPITAL_COMMUNITY): Payer: Self-pay | Admitting: *Deleted

## 2021-09-07 ENCOUNTER — Telehealth (HOSPITAL_COMMUNITY): Payer: Self-pay | Admitting: Lactation Services

## 2021-09-07 NOTE — Telephone Encounter (Signed)
Mom reports feeling good. No concerns about herself at this time. EPDS not coompleted as phone disconnected. Nurse left messgage on call back [facility] score=2) Mom reports baby is doing well. Feeding, peeing, and pooping without difficulty. Reports it looks like baby may have thrush. Given LC #'s and mom will call Peds/OB if needed. Safe sleep reviewed. Mom reports no concerns about baby at present.  Duffy Rhody, RN 09-07-2021 at 12:06pm

## 2021-09-07 NOTE — Telephone Encounter (Signed)
This mom called with questions regarding her 26 day old baby. Spoke with the RN from her Pedis office and wonder what the difference is between thrush and a white coating on her 8 day old baby's tongue.  Per Birthing mother - was tx with Antibiotics prior to delivery due to + GBS. And does have a hx of yeast infections, but hasn't had one in a long time.  The Baby has a white coating on the tongue and she has tried to wash it off with a moist cloth and it won't come off. Today is the 1st day the baby has shorten his feedings, otherwise has been feeding well, voids and stools are adequate for age.  Per mom does not have any signs of yeast on her nipples.  Per mom will be taking her older child to the [facility] tomorrow, and wil take the baby too to have the tongue check out.  LC recommended if the Pedis determines the baby has thrush to also call her OB to be tx herself due to the transfer back and forth between baby and mom.  Mom receptive to recommendations.

## 2021-10-17 DIAGNOSIS — Z124 Encounter for screening for malignant neoplasm of cervix: Secondary | ICD-10-CM | POA: Diagnosis not present

## 2022-10-01 DIAGNOSIS — Z3200 Encounter for pregnancy test, result unknown: Secondary | ICD-10-CM | POA: Diagnosis not present

## 2022-10-04 DIAGNOSIS — Z3201 Encounter for pregnancy test, result positive: Secondary | ICD-10-CM | POA: Diagnosis not present

## 2022-11-12 DIAGNOSIS — O09521 Supervision of elderly multigravida, first trimester: Secondary | ICD-10-CM | POA: Diagnosis not present

## 2022-11-12 DIAGNOSIS — Z3A11 11 weeks gestation of pregnancy: Secondary | ICD-10-CM | POA: Diagnosis not present

## 2022-11-12 DIAGNOSIS — Z3689 Encounter for other specified antenatal screening: Secondary | ICD-10-CM | POA: Diagnosis not present

## 2022-11-12 DIAGNOSIS — O2621 Pregnancy care for patient with recurrent pregnancy loss, first trimester: Secondary | ICD-10-CM | POA: Diagnosis not present

## 2022-11-12 LAB — OB RESULTS CONSOLE HEPATITIS B SURFACE ANTIGEN: Hepatitis B Surface Ag: NEGATIVE

## 2022-11-12 LAB — OB RESULTS CONSOLE RUBELLA ANTIBODY, IGM: Rubella: IMMUNE

## 2022-11-12 LAB — OB RESULTS CONSOLE HIV ANTIBODY (ROUTINE TESTING): HIV: NONREACTIVE

## 2022-12-17 DIAGNOSIS — Z118 Encounter for screening for other infectious and parasitic diseases: Secondary | ICD-10-CM | POA: Diagnosis not present

## 2022-12-17 LAB — OB RESULTS CONSOLE GC/CHLAMYDIA
Chlamydia: NEGATIVE
Neisseria Gonorrhea: NEGATIVE

## 2023-01-03 DIAGNOSIS — Z3A19 19 weeks gestation of pregnancy: Secondary | ICD-10-CM | POA: Diagnosis not present

## 2023-01-03 DIAGNOSIS — O09522 Supervision of elderly multigravida, second trimester: Secondary | ICD-10-CM | POA: Diagnosis not present

## 2023-01-30 DIAGNOSIS — Z362 Encounter for other antenatal screening follow-up: Secondary | ICD-10-CM | POA: Diagnosis not present

## 2023-02-13 NOTE — L&D Delivery Note (Signed)
 Delivery Note At 0451 a viable and healthy female was delivered spontaneously over intact perineum via svd (Presentation: cephalic; LOA ).  APGAR: 8, 9; weight  not yet done.   Placenta status:delivered ,spontaneous, intact .  Cord: 3vv,  with the following complications: none  Anesthesia:  lidocain for repair Episiotomy:  none Lacerations:  2nd degree Suture Repair: 3.0 vicryl Est. Blood Loss (mL):  - higher ebl d/t bleeding lacs; txa and pitocin given after delivery of placenta and good uterine tone and minimal bleeding after  Mom to postpartum.  Baby to Couplet care / Skin to Skin.  Vick Frees 05/23/2023, 5:18 AM

## 2023-04-23 LAB — OB RESULTS CONSOLE GBS: GBS: NEGATIVE

## 2023-05-23 ENCOUNTER — Encounter (HOSPITAL_COMMUNITY): Payer: Self-pay | Admitting: Obstetrics and Gynecology

## 2023-05-23 ENCOUNTER — Other Ambulatory Visit: Payer: Self-pay

## 2023-05-23 ENCOUNTER — Inpatient Hospital Stay (HOSPITAL_COMMUNITY)
Admission: AD | Admit: 2023-05-23 | Discharge: 2023-05-24 | DRG: 807 | Disposition: A | Discharge status: Home / Self Care | Attending: Obstetrics and Gynecology | Admitting: Obstetrics and Gynecology

## 2023-05-23 DIAGNOSIS — O26893 Other specified pregnancy related conditions, third trimester: Secondary | ICD-10-CM | POA: Diagnosis present

## 2023-05-23 DIAGNOSIS — Z3A39 39 weeks gestation of pregnancy: Secondary | ICD-10-CM | POA: Diagnosis not present

## 2023-05-23 DIAGNOSIS — O3663X0 Maternal care for excessive fetal growth, third trimester, not applicable or unspecified: Secondary | ICD-10-CM | POA: Diagnosis present

## 2023-05-23 DIAGNOSIS — Z8249 Family history of ischemic heart disease and other diseases of the circulatory system: Secondary | ICD-10-CM | POA: Diagnosis not present

## 2023-05-23 LAB — CBC
HCT: 35.7 % — ABNORMAL LOW (ref 36.0–46.0)
Hemoglobin: 11.3 g/dL — ABNORMAL LOW (ref 12.0–15.0)
MCH: 27 pg (ref 26.0–34.0)
MCHC: 31.7 g/dL (ref 30.0–36.0)
MCV: 85.4 fL (ref 80.0–100.0)
Platelets: 279 10*3/uL (ref 150–400)
RBC: 4.18 MIL/uL (ref 3.87–5.11)
RDW: 14.5 % (ref 11.5–15.5)
WBC: 12.3 10*3/uL — ABNORMAL HIGH (ref 4.0–10.5)
nRBC: 0 % (ref 0.0–0.2)

## 2023-05-23 LAB — TYPE AND SCREEN
ABO/RH(D): O POS
Antibody Screen: NEGATIVE

## 2023-05-23 LAB — RPR: RPR Ser Ql: NONREACTIVE

## 2023-05-23 LAB — POCT FERN TEST: POCT Fern Test: POSITIVE

## 2023-05-23 MED ORDER — BENZOCAINE-MENTHOL 20-0.5 % EX AERO
1.0000 | INHALATION_SPRAY | CUTANEOUS | Status: DC | PRN
  Administered 2023-05-24: 1 via TOPICAL
  Filled 2023-05-23: qty 56

## 2023-05-23 MED ORDER — FLEET ENEMA RE ENEM: 1.0000 | ENEMA | Freq: Every day | RECTAL | Status: DC | PRN

## 2023-05-23 MED ORDER — COCONUT OIL OIL: 1.0000 | TOPICAL_OIL | Status: DC | PRN

## 2023-05-23 MED ORDER — IBUPROFEN 600 MG PO TABS
600.0000 mg | ORAL_TABLET | Freq: Four times a day (QID) | ORAL | Status: DC
  Administered 2023-05-23 – 2023-05-24 (×3): 600 mg via ORAL
  Filled 2023-05-23 (×4): qty 1

## 2023-05-23 MED ORDER — TRANEXAMIC ACID-NACL 1000-0.7 MG/100ML-% IV SOLN
1000.0000 mg | INTRAVENOUS | Status: AC
  Administered 2023-05-23: 1000 mg via INTRAVENOUS
  Filled 2023-05-23: qty 100

## 2023-05-23 MED ORDER — LACTATED RINGERS IV SOLN: 500.0000 mL | INTRAVENOUS | Status: DC | PRN

## 2023-05-23 MED ORDER — LACTATED RINGERS IV SOLN: INTRAVENOUS | Status: DC

## 2023-05-23 MED ORDER — OXYTOCIN-SODIUM CHLORIDE 30-0.9 UT/500ML-% IV SOLN
2.5000 [IU]/h | INTRAVENOUS | Status: DC
  Filled 2023-05-23: qty 500

## 2023-05-23 MED ORDER — OXYCODONE-ACETAMINOPHEN 5-325 MG PO TABS: 2.0000 | ORAL_TABLET | ORAL | Status: DC | PRN

## 2023-05-23 MED ORDER — LIDOCAINE HCL (PF) 1 % IJ SOLN
30.0000 mL | INTRAMUSCULAR | Status: AC | PRN
  Administered 2023-05-23: 30 mL via SUBCUTANEOUS
  Filled 2023-05-23: qty 30

## 2023-05-23 MED ORDER — SIMETHICONE 80 MG PO CHEW: 80.0000 mg | CHEWABLE_TABLET | ORAL | Status: DC | PRN

## 2023-05-23 MED ORDER — HYDROXYZINE HCL 50 MG PO TABS: 50.0000 mg | ORAL_TABLET | Freq: Four times a day (QID) | ORAL | Status: DC | PRN

## 2023-05-23 MED ORDER — ONDANSETRON HCL 4 MG/2ML IJ SOLN: 4.0000 mg | Freq: Four times a day (QID) | INTRAMUSCULAR | Status: DC | PRN

## 2023-05-23 MED ORDER — OXYCODONE-ACETAMINOPHEN 5-325 MG PO TABS: 1.0000 | ORAL_TABLET | ORAL | Status: DC | PRN

## 2023-05-23 MED ORDER — ACETAMINOPHEN 325 MG PO TABS: 650.0000 mg | ORAL_TABLET | ORAL | Status: DC | PRN

## 2023-05-23 MED ORDER — PRENATAL MULTIVITAMIN CH
1.0000 | ORAL_TABLET | Freq: Every day | ORAL | Status: DC
  Filled 2023-05-23: qty 1

## 2023-05-23 MED ORDER — FENTANYL CITRATE (PF) 100 MCG/2ML IJ SOLN
50.0000 ug | INTRAMUSCULAR | Status: DC | PRN
Start: 2023-05-23 — End: 2023-05-23

## 2023-05-23 MED ORDER — DIPHENHYDRAMINE HCL 25 MG PO CAPS: 25.0000 mg | ORAL_CAPSULE | Freq: Four times a day (QID) | ORAL | Status: DC | PRN

## 2023-05-23 MED ORDER — ONDANSETRON HCL 4 MG/2ML IJ SOLN: 4.0000 mg | INTRAMUSCULAR | Status: DC | PRN

## 2023-05-23 MED ORDER — WITCH HAZEL-GLYCERIN EX PADS: 1.0000 | MEDICATED_PAD | CUTANEOUS | Status: DC | PRN

## 2023-05-23 MED ORDER — DIBUCAINE (PERIANAL) 1 % EX OINT: 1.0000 | TOPICAL_OINTMENT | CUTANEOUS | Status: DC | PRN

## 2023-05-23 MED ORDER — OXYTOCIN BOLUS FROM INFUSION
333.0000 mL | Freq: Once | INTRAVENOUS | Status: AC
  Administered 2023-05-23: 333 mL via INTRAVENOUS

## 2023-05-23 MED ORDER — SOD CITRATE-CITRIC ACID 500-334 MG/5ML PO SOLN: 30.0000 mL | ORAL | Status: DC | PRN

## 2023-05-23 MED ORDER — ONDANSETRON HCL 4 MG PO TABS: 4.0000 mg | ORAL_TABLET | ORAL | Status: DC | PRN

## 2023-05-23 MED ORDER — TETANUS-DIPHTH-ACELL PERTUSSIS 5-2.5-18.5 LF-MCG/0.5 IM SUSY: 0.5000 mL | PREFILLED_SYRINGE | Freq: Once | INTRAMUSCULAR | Status: DC

## 2023-05-23 MED ORDER — SENNOSIDES-DOCUSATE SODIUM 8.6-50 MG PO TABS
2.0000 | ORAL_TABLET | Freq: Every day | ORAL | Status: DC
  Administered 2023-05-24: 2 via ORAL
  Filled 2023-05-23: qty 2

## 2023-05-23 NOTE — H&P (Signed)
 Sarah Mullins is a 38 y.o. [id number] at [redacted]w[redacted]d gestation presents for complaint of Contractions and Loss of fluid. ROM around 8:30p last night, clear fluid. Contractions started on the way to hospital. +fm Had planned waterbirth with CNM, CNM not available.   Antepartum course:  ama; LGA with efw 99% at 37 wga (8'10") ; h/o rpl, baby asa early in preg PNCare at [facility] since 11 wks.  See complete pre-natal records  History OB History     Gravida  9   Para  5   Term  5   Preterm      AB  3   Living  5      SAB  3   IAB      Ectopic      Multiple  0   Live Births  5          Past Medical History:  Diagnosis Date   Chicken pox    Chronic constipation    Concussion    Instability of joint    right knee   Multinodular goiter 2020   euthyroid, bx benign   Raynaud's phenomenon    mild   Past Surgical History:  Procedure Laterality Date   APPENDECTOMY  2002   WISDOM TOOTH EXTRACTION     Family History: family history includes Cervical cancer in her mother; Dementia in her paternal grandfather; Hearing loss in her paternal grandmother; Heart disease in her maternal grandfather; Hypertension in her paternal grandmother; Uterine cancer in her maternal grandmother. Social History:  reports that she has never smoked. She has never used smokeless tobacco. She reports that she does not currently use alcohol. She reports that she does not use drugs.  ROS: See above otherwise negative  Prenatal labs:  ABO, Rh: --/--/O POS (04/10 0159) Antibody: NEG (04/10 0159) Rubella:  immune RPR:   non reactive HBsAg:   negative HIV:  non reactive GBS:   neg 1 hr Glucola: Normal Genetic screening:  declined Anatomy US: Normal  Physical Exam:   Dilation: 5 Effacement (%): 90 Station: -2 Exam by:: Dr. Amado Nash Blood pressure 104/74, pulse 81, temperature 98 F (36.7 C), temperature source Oral, resp. rate 18, height 5\' 1"  (1.549 m), weight 71.7 kg, SpO2 100%,  unknown if currently breastfeeding. A&O x 3 HEENT: Normal Lungs: CTAB CV: RRR Abdominal: Soft, Non-tender, Gravid, and Estimated fetal weight: 8 1/2 - 9 lbs  Lower Extremities: Non-edematous, Non-tender  Pelvic Exam:      Dilatation: 5cm     Effacement: 90%     Station: -2     Presentation: Cephalic  Labs:  CBC:  Lab Results  Component Value Date   WBC 12.3 (H) 05/23/2023   RBC 4.18 05/23/2023   HGB 11.3 (L) 05/23/2023   HCT 35.7 (L) 05/23/2023   MCV 85.4 05/23/2023   MCH 27.0 05/23/2023   MCHC 31.7 05/23/2023   RDW 14.5 05/23/2023   PLT 279 05/23/2023   FHT: 140s, nml variability, +accels, couple early decels to 90s then back to baseline TOCO: q 1-4 min   Prenatal Transfer Tool  Maternal Diabetes: No Genetic Screening: Declined Maternal Ultrasounds/Referrals: Normal Fetal Ultrasounds or other Referrals:  None Maternal Substance Abuse:  No Significant Maternal Medications:  None Significant Maternal Lab Results: Group B Strep negative 04/23/23 Number of Prenatal Visits:greater than 3 verified prenatal visits Maternal Vaccinations: none Other Comments:  None   Assessment/Plan:  38 y.o. [id number] at [redacted]w[redacted]d gestation   Active labor - admit  for delivery; anticipate svd; plan txa, with delivery; high pph risk and plan meds in room; pt initially declining TXA and pitocin with delivery - will encourage meds Fetal status reassuring Gbs neg LGA, proven pelvis to 9'7" Ama - declined genetic testing   Vick Frees 05/23/2023, 2:59 AM

## 2023-05-23 NOTE — MAU Note (Signed)
 Pt informed that the ultrasound is considered a limited OB ultrasound and is not intended to be a complete ultrasound exam.  Patient also informed that the ultrasound is not being completed with the intent of assessing for fetal or placental anomalies or any pelvic abnormalities.  Explained that the purpose of today's ultrasound is to assess for  presentation.  Patient acknowledges the purpose of the exam and the limitations of the study.     Vertex verified by Felipa Furnace, RN

## 2023-05-23 NOTE — Lactation Note (Signed)
 This note was copied from a baby's chart. Lactation Consultation Note  Patient Name: Sarah Mullins [id number] Date: 05/23/2023 Age:38 hours Reason for consult: Initial assessment;Term (hx Raynaud's Syndrome)  P6- MOB reports that feeding shave been going great and she has no questions or concerns. MOB has breastfeed all of her 5 older children for 6 months each. Per MOB, she has a "solid 6 month supply with all of her children." MOB declines lactation follow ups while in the hospital. [facility] reviewed the CDC milk storage guidelines, [facility] service handout and engorgement/breast care. LC encouraged MOB to call for further assistance as needed.  Maternal Data Does the patient have breastfeeding experience prior to this delivery?: Yes How long did the patient breastfeed?: 6 months with all five older children  Feeding Mother's Current Feeding Choice: Breast Milk  Lactation Tools Discussed/Used Pump Education: Milk Storage  Interventions Interventions: Breast feeding basics reviewed;Education;LC Services brochure  Discharge Discharge Education: Engorgement and breast care;Warning signs for feeding baby Pump: DEBP;Personal  Consult Status Consult Status: Complete (mother declined follow up) Date: 05/23/23    Dema Severin BS, IBCLC 05/23/2023, 9:35 PM

## 2023-05-23 NOTE — MAU Note (Signed)
 Sarah Mullins is a 38 y.o. at Unknown here in MAU reporting: she reports her water broke around 8:20pm she reports it was clear fluid and is continuing to leak. Endorses +FM, denies VB. Just started ctx about 1hr ago, reports only a couple ctx in the past hour.    Onset of complaint: 8:20pm  Vitals:   05/23/23 0044  BP: 122/78  Pulse: 81  Resp: 18  Temp: 97.9 F (36.6 C)  SpO2: 100%     FHT: 145 initial external  Lab orders placed from triage: Mau labor

## 2023-05-24 LAB — CBC
HCT: 28.9 % — ABNORMAL LOW (ref 36.0–46.0)
Hemoglobin: 9.1 g/dL — ABNORMAL LOW (ref 12.0–15.0)
MCH: 27.1 pg (ref 26.0–34.0)
MCHC: 31.5 g/dL (ref 30.0–36.0)
MCV: 86 fL (ref 80.0–100.0)
Platelets: 245 10*3/uL (ref 150–400)
RBC: 3.36 MIL/uL — ABNORMAL LOW (ref 3.87–5.11)
RDW: 14.7 % (ref 11.5–15.5)
WBC: 11.6 10*3/uL — ABNORMAL HIGH (ref 4.0–10.5)
nRBC: 0 % (ref 0.0–0.2)

## 2023-05-24 NOTE — Discharge Summary (Signed)
 OB Discharge Summary  Patient Name: Sarah Mullins DOB: 09/15/1985 MRN: [id number]  Date of admission: 05/23/2023 Delivering provider: Rhoderick Moody E  Admitting diagnosis: Indication for care in labor or delivery [O75.9] Intrauterine pregnancy: [redacted]w[redacted]d     Secondary diagnosis: Patient Active Problem List   Diagnosis Date Noted   Indication for care in labor or delivery 05/23/2023   Normal labor 08/30/2021   Positive GBS test 08/30/2021   SVD 7/19 08/30/2021   Postpartum care following vaginal delivery 7/19 08/30/2021   Multinodular goiter 10/14/2019   Thyroid nodule 10/06/2018   Additional problems:na   Date of discharge: 05/24/2023   Discharge diagnosis: Principal Problem:   Indication for care in labor or delivery                                                              Post partum procedures: na  Augmentation: N/A Pain control: Local Laceration:2nd degree Episiotomy:None Complications: None  Hospital course:  Onset of Labor With Vaginal Delivery      38 y.o. yo [id number] at [redacted]w[redacted]d was admitted in Active Labor on 05/23/2023. Labor course was complicated byna  Membrane Rupture Time/Date: 8:30 PM,05/22/2023  Delivery Method:Vaginal, Spontaneous Operative Delivery:N/A Episiotomy: None Lacerations:  2nd degree Patient had a postpartum course complicated by anemia.  She is ambulating, tolerating a regular diet, passing flatus, and urinating well. Patient is discharged home in stable condition on 05/24/23.  Newborn Data: Birth date:05/23/2023 Birth time:4:51 AM Gender:Female Living status:Living Apgars:8 ,9  Weight:4281 g  Subjective: Alert Physical exam  Vitals:   05/23/23 1133 05/23/23 1533 05/23/23 1933 05/24/23 0452  BP: 102/67 114/70 118/83 95/62  Pulse: 99 89 100 82  Resp: 18 15 16 17   Temp: 99.1 F (37.3 C) 98.4 F (36.9 C) 98.4 F (36.9 C) 97.7 F (36.5 C)  TempSrc: Oral Oral Oral Oral  SpO2: 99% 99% 99% 99%  Weight:      Height:        General: alert, cooperative, and no distress Lochia: appropriate Uterine Fundus: firm Incision: Healing well with no significant drainage, No significant erythema Perineum: repair intact, tr edema DVT Evaluation: No evidence of DVT seen on physical exam. Labs: Lab Results  Component Value Date   WBC 11.6 (H) 05/24/2023   HGB 9.1 (L) 05/24/2023   HCT 28.9 (L) 05/24/2023   MCV 86.0 05/24/2023   PLT 245 05/24/2023      Latest Ref Rng & Units 11/18/2019    3:01 PM  CMP  Glucose 70 - 99 mg/dL 87   BUN 6 - 23 mg/dL 13   Creatinine 1.47 - 1.20 mg/dL 8.29   Sodium 562 - 130 mEq/L 135   Potassium 3.5 - 5.1 mEq/L 4.2   Chloride 96 - 112 mEq/L 101   CO2 19 - 32 mEq/L 28   Calcium 8.4 - 10.5 mg/dL 9.2   Total Protein 6.0 - 8.3 g/dL 7.1   Total Bilirubin 0.2 - 1.2 mg/dL 0.3   Alkaline Phos 39 - 117 U/L 48   AST 0 - 37 U/L 17   ALT 0 - 35 U/L 17       05/24/2023    5:02 AM 08/30/2021    7:55 AM 01/31/2017    9:00 AM  Inocente Salles Postnatal  Depression Scale Screening Tool  I have been able to laugh and see the funny side of things. 0 0 0  I have looked forward with enjoyment to things. 0 0 0  I have blamed myself unnecessarily when things went wrong. 0 0 0  I have been anxious or worried for no good reason. 0 1 0  I have felt scared or panicky for no good reason. 0 0 0  Things have been getting on top of me. 0 1 0  I have been so unhappy that I have had difficulty sleeping. 0 0 0  I have felt sad or miserable. 0 0 0  I have been so unhappy that I have been crying. 0 0 0  The thought of harming myself has occurred to me. 0 0 0  Edinburgh Postnatal Depression Scale Total 0 2 0   Vaccines: TDaP         na         Flu na Discharge instruction:  per After Visit Summary,  [facility] booklet and  "[facility] Baby [facility]" hospital booklet After Visit Meds:  Allergies as of 05/24/2023       Reactions   Beta Adrenergic Blockers Other (See Comments)   circulation problems         Medication List     STOP taking these medications    aspirin EC 81 MG tablet       TAKE these medications    acetaminophen 325 MG tablet Commonly known as: Tylenol Take 2 tablets (650 mg total) by mouth every 4 (four) hours as needed (for pain scale < 4).   b complex vitamins tablet Take 1 tablet by mouth daily.   benzocaine-Menthol 20-0.5 % Aero Commonly known as: DERMOPLAST Apply 1 Application topically as needed for irritation (perineal discomfort).   coconut oil Oil Apply 1 Application topically as needed.   ibuprofen 600 MG tablet Commonly known as: ADVIL Take 1 tablet (600 mg total) by mouth every 6 (six) hours.   multivitamin capsule Take 1 capsule by mouth daily.   prenatal multivitamin Tabs tablet Take 2 tablets by mouth in the morning, at noon, and at bedtime.   vitamin C 250 MG tablet Commonly known as: ASCORBIC ACID Take 250 mg by mouth daily.       Diet: routine diet Activity: Advance as tolerated. Pelvic rest for 6 weeks.  Postpartum contraception: TBA in office Newborn Data: Live born female  Birth Weight: 9 lb 7 oz (4281 g) APGAR: 8, 9  Newborn Delivery   Birth date/time: 05/23/2023 04:51:45 Delivery type: Vaginal, Spontaneous     named na Baby Feeding: Breast Disposition:home with mother Circumcision: done Delivery Report: Review the Delivery Report for details.   Follow up:  Signed: Milbert Coulter, MSN 05/24/2023, 9:14 AM

## 2023-05-24 NOTE — Progress Notes (Signed)
 Post Partum Day 1 Subjective: no complaints  Objective: Blood pressure 95/62, pulse 82, temperature 97.7 F (36.5 C), temperature source Oral, resp. rate 17, height 5\' 1"  (1.549 m), weight 71.7 kg, SpO2 99%, unknown if currently breastfeeding.  Physical Exam:  General: alert, cooperative, and appears stated age Lochia: appropriate Uterine Fundus: firm Incision: healing well DVT Evaluation: No evidence of DVT seen on physical exam.  Recent Labs    05/23/23 0159 05/24/23 0518  HGB 11.3* 9.1*  HCT 35.7* 28.9*    Assessment/Plan: Stable PPD 1 ABL anemia Po fe and pnv Discharge home   LOS: 1 day   Lenoard Aden, MD 05/24/2023, 9:04 AM

## 2023-06-03 ENCOUNTER — Telehealth (HOSPITAL_COMMUNITY): Payer: Self-pay

## 2023-06-03 NOTE — Telephone Encounter (Signed)
 06/03/2023 1712  Name: Sarah Mullins MRN: [id number] DOB: October 08, 1985  Reason for Call:  Transition of Care Hospital Discharge Call  Contact Status: Patient Contact Status: Message  Language assistant needed:          Follow-Up Questions:    Dimple Francis Postnatal Depression Scale:  In the Past 7 Days:    PHQ2-9 Depression Scale:     Discharge Follow-up:    Post-discharge interventions: NA  Signature  Wadell Guild
